# Patient Record
Sex: Male | Born: 1975 | Race: White | Hispanic: No | Marital: Married | Smoking: Never smoker
Health system: Southern US, Community
[De-identification: ages and names within clinical notes are randomized; demographics above are authoritative.]

---

## 2003-08-04 ENCOUNTER — Emergency Department (HOSPITAL_COMMUNITY): Admission: EM | Admit: 2003-08-04 | Discharge: 2003-08-04 | Payer: Self-pay | Admitting: Emergency Medicine

## 2003-08-08 ENCOUNTER — Ambulatory Visit (HOSPITAL_COMMUNITY): Admission: RE | Admit: 2003-08-08 | Discharge: 2003-08-08 | Payer: Self-pay

## 2005-04-10 ENCOUNTER — Emergency Department (HOSPITAL_COMMUNITY): Admission: EM | Admit: 2005-04-10 | Discharge: 2005-04-11 | Payer: Self-pay | Admitting: Family Medicine

## 2006-10-23 IMAGING — CT CT PELVIS W/ CM
2 of 5 series · 17 of 46 positions shown, 19 images · IV contrast (ORAL OMNI 350 & 75ML OMNI 300)
Comparison: 08/08/2003.

CLINICAL DATA: Abdominal pain.   Assess for appendicitis.  
ABDOMEN CT WITH CONTRAST:
TECHNIQUE: Multidetector CT imaging of the abdomen was performed following the standard protocol during bolus administration of intravenous contrast.
Contrast:  ________cc Omnipaque 300
TECHNIQUE: Multidetector CT imaging of the pelvis was performed following the standard protocol during bolus administration of intravenous contrast.
Urinary bladder negative.  Pelvic bowel loops are negative.  No lymphadenopathy.  
A tiny less than 3 mm stone is identified at the right UVJ.

[Series 2: routine abdomen · axial · 0.70mm/px · z∈[-451,-51]mm · 14 of 91 slices shown, 16 images]
[im 6/91  soft-tissue]
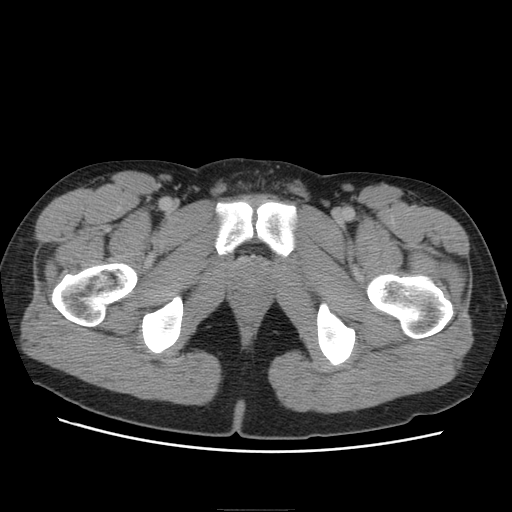
[im 6/91  bone]
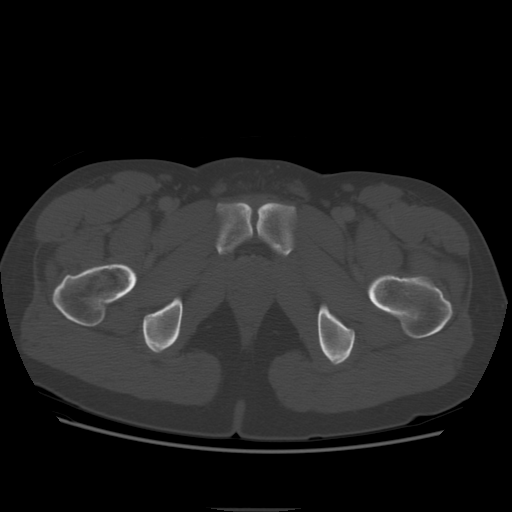
[im 11/91  soft-tissue]
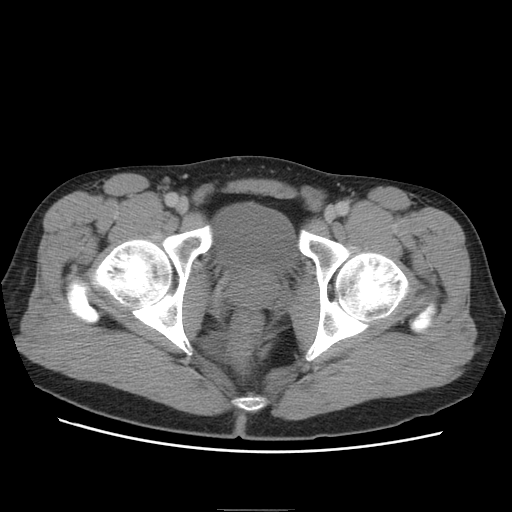
[im 21/91  soft-tissue]
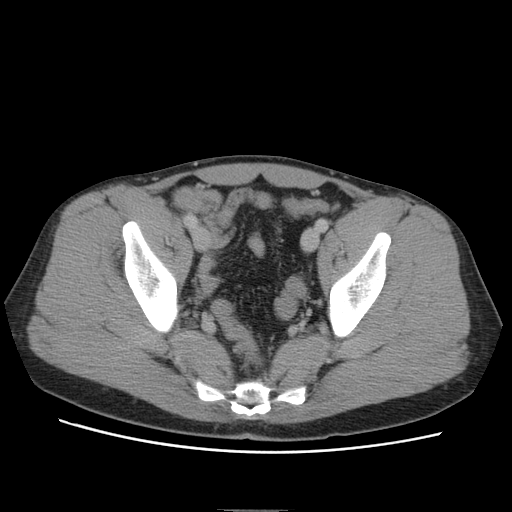
[im 26/91  soft-tissue]
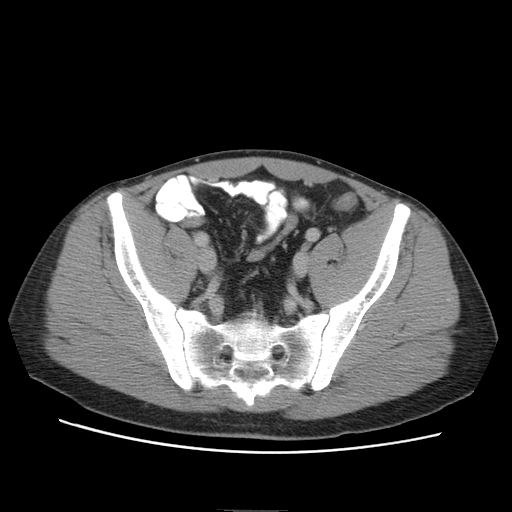
[im 31/91  soft-tissue]
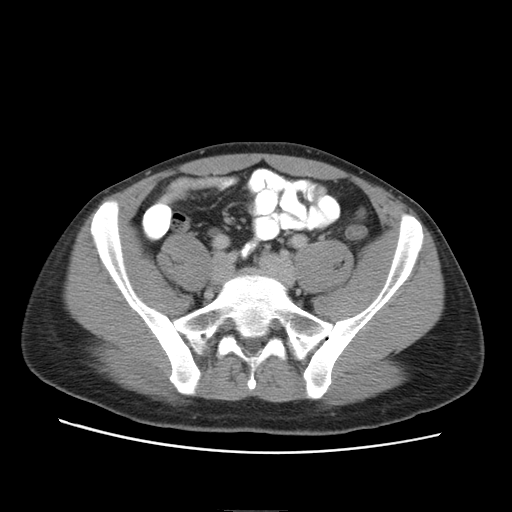
[im 36/91  soft-tissue]
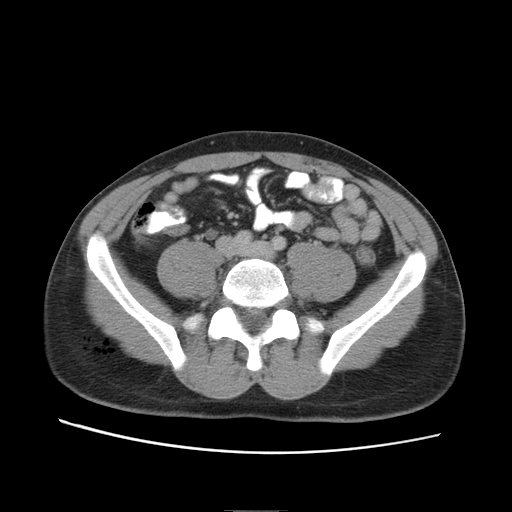
[im 41/91  soft-tissue]
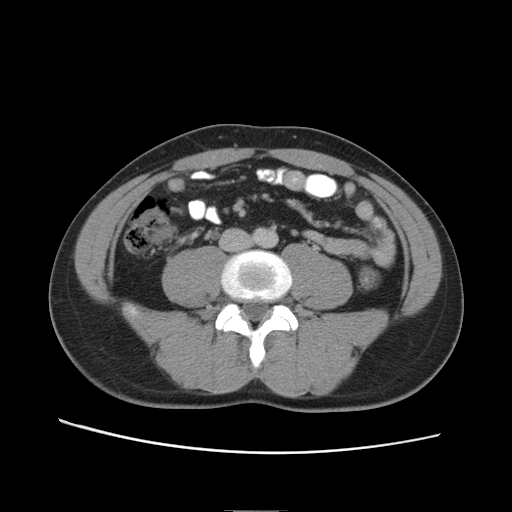
[im 51/91  soft-tissue]
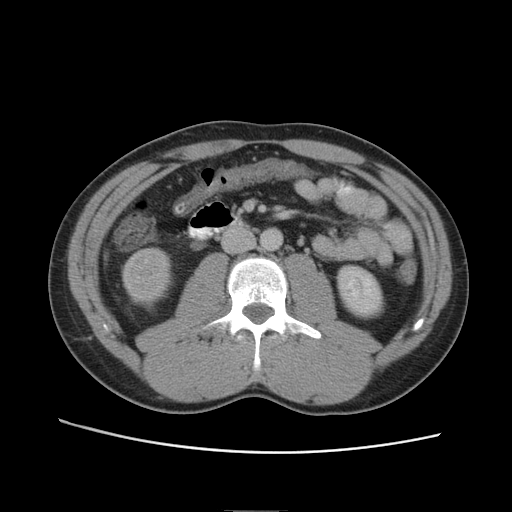
[im 56/91  soft-tissue]
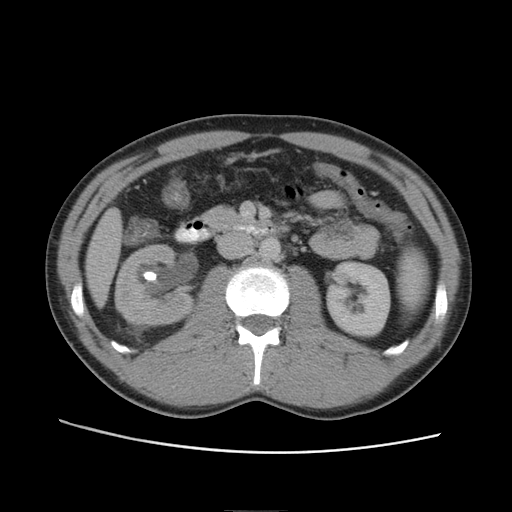
[im 56/91  bone]
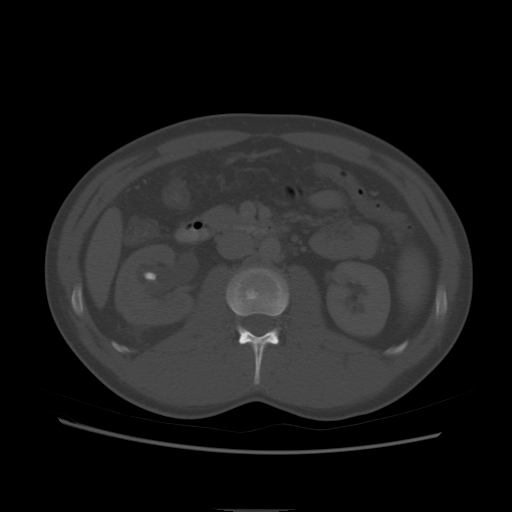
[im 61/91  soft-tissue]
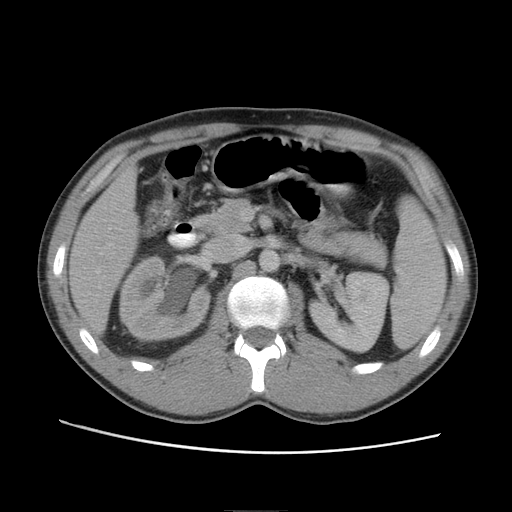
[im 66/91  soft-tissue]
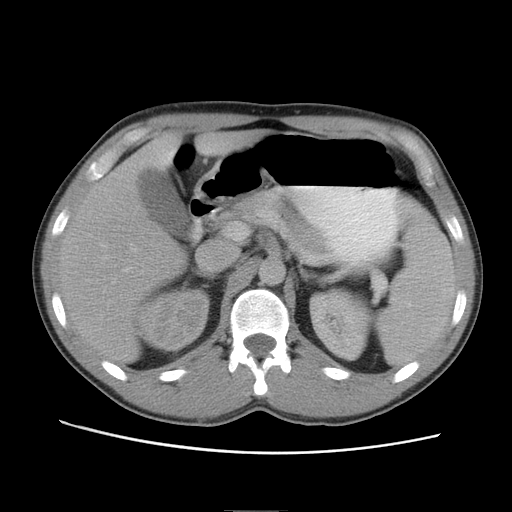
[im 71/91  soft-tissue]
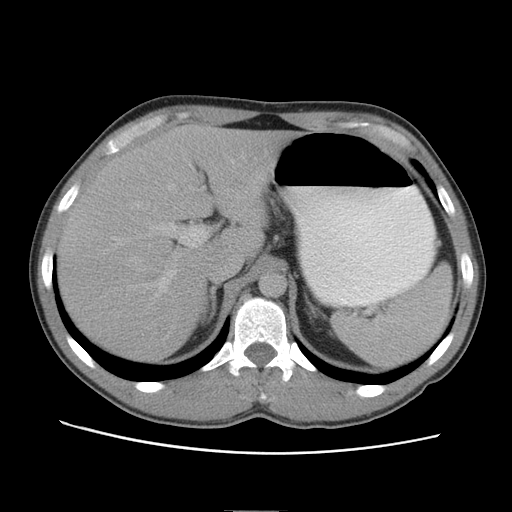
[im 81/91  soft-tissue]
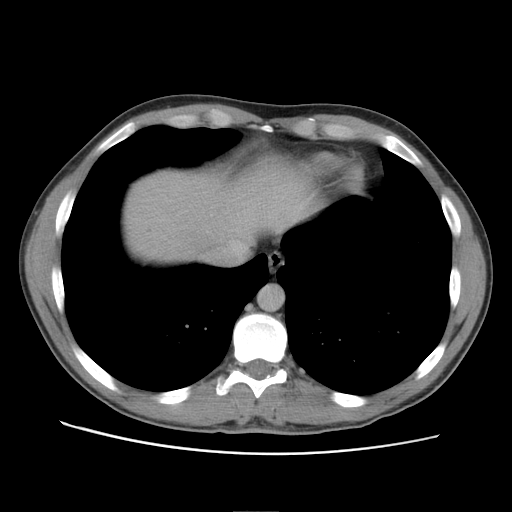
[im 86/91  soft-tissue]
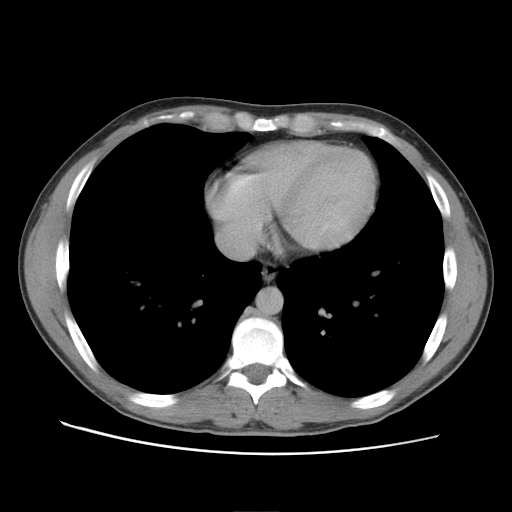

[Series 104: reformatted · coronal · 0.96mm/px · 3 of 109 slices shown]
[im 37/109  soft-tissue]
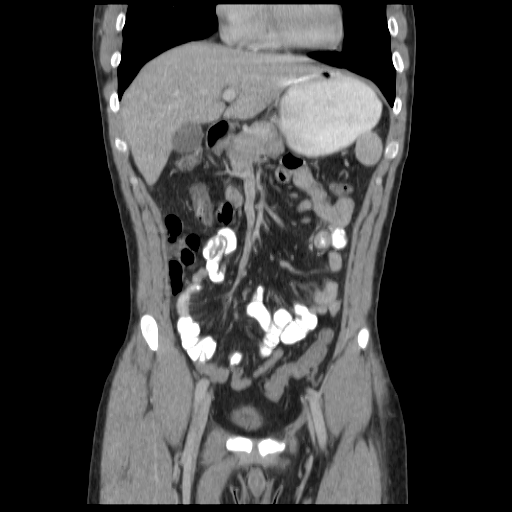
[im 49/109  soft-tissue]
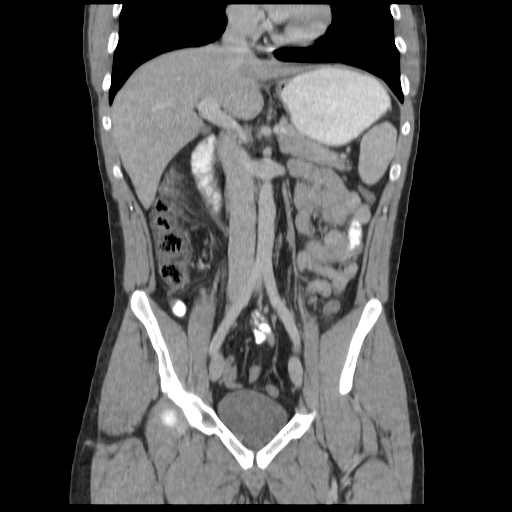
[im 61/109  soft-tissue]
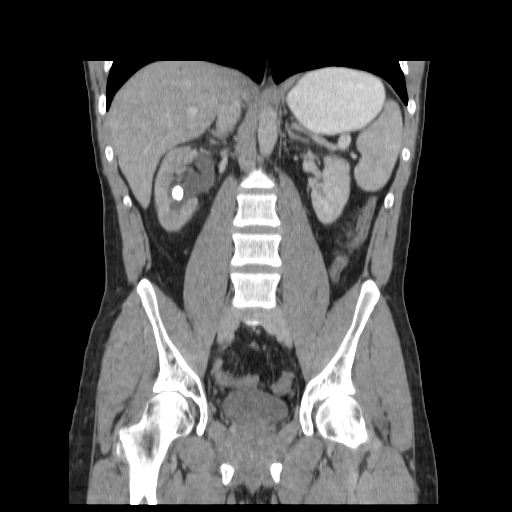

[17 of 46 positions shown; findings below may reference images not displayed]

FINDINGS: The visualized lung bases are clear.  
The liver is normal in attenuation and morphology.  
The spleen is negative.  Pancreas is negative.  
The left kidney is negative.  The adrenal glands are negative.  Within the lower pole collecting system of the left kidney there is an 11.3 x 6.7 mm stone.  
  There is delayed perfusion to the right kidney.  There is moderate to marked right-sided hydronephrosis and hydroureter.  There is a small less than 3 mm stone at the right UVJ. 
Negative for lymphadenopathy.  
Bowel loops are negative.
IMPRESSION: 1.  Large right renal stone. 
2.  Right-sided hydronephrosis with a small less than 3 mm right UVJ stone.  
PELVIS CT WITH CONTRAST:
IMPRESSION: Small right UVJ stone.

## 2007-01-31 ENCOUNTER — Emergency Department (HOSPITAL_COMMUNITY): Admission: EM | Admit: 2007-01-31 | Discharge: 2007-01-31 | Payer: Self-pay | Admitting: Emergency Medicine

## 2017-04-01 ENCOUNTER — Ambulatory Visit: Payer: Self-pay | Admitting: Nurse Practitioner

## 2017-04-01 ENCOUNTER — Encounter: Payer: Self-pay | Admitting: Nurse Practitioner

## 2017-04-01 VITALS — BP 110/80 | HR 85 | Temp 98.3°F | Resp 16 | Wt 204.4 lb

## 2017-04-01 DIAGNOSIS — J069 Acute upper respiratory infection, unspecified: Secondary | ICD-10-CM

## 2017-04-01 NOTE — Patient Instructions (Signed)

## 2017-04-01 NOTE — Progress Notes (Signed)
   Subjective:    Patient ID: Angela Adamhillip Mayorga, male    DOB: [birth year], 42 y.o.   MRN: [id number]  HPI Patient comes in today C/o headache, nausea and sore throat. No fever currently. Wife had flu last week.    Review of Systems  Constitutional: Negative for chills and fever.  HENT: Positive for congestion, postnasal drip and sore throat. Negative for trouble swallowing.   Respiratory: Negative for cough.   Cardiovascular: Negative.   Genitourinary: Negative.   Psychiatric/Behavioral: Negative.   All other systems reviewed and are negative.      Objective:   Physical Exam  Constitutional: He is oriented to person, place, and time. He appears well-developed and well-nourished. No distress.  HENT:  Right Ear: Hearing, tympanic membrane, external ear and ear canal normal.  Left Ear: Hearing, tympanic membrane, external ear and ear canal normal.  Nose: Mucosal edema and rhinorrhea present. Right sinus exhibits no maxillary sinus tenderness and no frontal sinus tenderness. Left sinus exhibits no maxillary sinus tenderness and no frontal sinus tenderness.  Mouth/Throat: Uvula is midline, oropharynx is clear and moist and mucous membranes are normal.  Neck: Normal range of motion.  Cardiovascular: Normal rate and regular rhythm.  Pulmonary/Chest: Effort normal and breath sounds normal.  Lymphadenopathy:    He has no cervical adenopathy.  Neurological: He is alert and oriented to person, place, and time.  Skin: Skin is warm.  Psychiatric: He has a normal mood and affect. His behavior is normal. Judgment and thought content normal.    BP 110/80 (BP Location: Right Arm, Patient Position: Sitting, Cuff Size: Normal)   Pulse 85   Temp 98.3 F (36.8 C) (Oral)   Resp 16   Wt 204 lb 6.4 oz (92.7 kg)   SpO2 96%        Assessment & Plan:   1. Viral upper respiratory infection   1. Take meds as prescribed 2. Use a cool mist humidifier especially during the winter months and when heat  has been humid. 3. Use saline nose sprays frequently 4. Saline irrigations of the nose can be very helpful if done frequently.  * 4X daily for 1 week*  * Use of a nettie pot can be helpful with this. Follow directions with this* 5. Drink plenty of fluids 6. Keep thermostat turn down low 7.For any cough or congestion  Use plain Mucinex- regular strength or max strength is fine   * Children- consult with Pharmacist for dosing 8. For fever or aces or pains- take tylenol or ibuprofen appropriate for age and weight.  * for fevers greater than 101 orally you may alternate ibuprofen and tylenol every  3 hours.    Mary-Margaret Daphine DeutscherMartin, FNP

## 2020-03-05 ENCOUNTER — Other Ambulatory Visit: Payer: Self-pay

## 2020-03-05 DIAGNOSIS — Z20822 Contact with and (suspected) exposure to covid-19: Secondary | ICD-10-CM

## 2020-03-09 LAB — NOVEL CORONAVIRUS, NAA

## 2020-12-03 ENCOUNTER — Other Ambulatory Visit: Payer: Self-pay

## 2020-12-03 ENCOUNTER — Encounter (HOSPITAL_COMMUNITY): Payer: Self-pay

## 2020-12-03 ENCOUNTER — Emergency Department (HOSPITAL_COMMUNITY)
Admission: EM | Admit: 2020-12-03 | Discharge: 2020-12-04 | Disposition: A | Discharge status: Home / Self Care | Payer: PRIVATE HEALTH INSURANCE | Attending: Emergency Medicine | Admitting: Emergency Medicine

## 2020-12-03 ENCOUNTER — Emergency Department (HOSPITAL_COMMUNITY): Payer: PRIVATE HEALTH INSURANCE

## 2020-12-03 DIAGNOSIS — R110 Nausea: Secondary | ICD-10-CM | POA: Diagnosis not present

## 2020-12-03 DIAGNOSIS — F419 Anxiety disorder, unspecified: Secondary | ICD-10-CM | POA: Diagnosis not present

## 2020-12-03 DIAGNOSIS — I10 Essential (primary) hypertension: Secondary | ICD-10-CM | POA: Diagnosis not present

## 2020-12-03 DIAGNOSIS — R002 Palpitations: Secondary | ICD-10-CM | POA: Diagnosis not present

## 2020-12-03 DIAGNOSIS — Z7982 Long term (current) use of aspirin: Secondary | ICD-10-CM | POA: Insufficient documentation

## 2020-12-03 DIAGNOSIS — R42 Dizziness and giddiness: Secondary | ICD-10-CM | POA: Diagnosis not present

## 2020-12-03 LAB — COMPREHENSIVE METABOLIC PANEL
ALT: 41 U/L (ref 0–44)
AST: 26 U/L (ref 15–41)
Albumin: 4.4 g/dL (ref 3.5–5.0)
Alkaline Phosphatase: 75 U/L (ref 38–126)
Anion gap: 8 (ref 5–15)
BUN: 12 mg/dL (ref 6–20)
CO2: 26 mmol/L (ref 22–32)
Calcium: 9.6 mg/dL (ref 8.9–10.3)
Chloride: 106 mmol/L (ref 98–111)
Creatinine, Ser: 0.89 mg/dL (ref 0.61–1.24)
GFR, Estimated: >60 mL/min (ref >60)
Glucose, Bld: 81 mg/dL (ref 70–99)
Potassium: 3.9 mmol/L (ref 3.5–5.1)
Sodium: 140 mmol/L (ref 135–145)
Total Bilirubin: 0.5 mg/dL (ref 0.3–1.2)
Total Protein: 7.1 g/dL (ref 6.5–8.1)

## 2020-12-03 LAB — CBC
HCT: 45.4 % (ref 39.0–52.0)
Hemoglobin: 15.7 g/dL (ref 13.0–17.0)
MCH: 30.2 pg (ref 26.0–34.0)
MCHC: 34.6 g/dL (ref 30.0–36.0)
MCV: 87.3 fL (ref 80.0–100.0)
Platelets: 310 10*3/uL (ref 150–400)
RBC: 5.2 MIL/uL (ref 4.22–5.81)
RDW: 12.7 % (ref 11.5–15.5)
WBC: 9.2 10*3/uL (ref 4.0–10.5)
nRBC: 0 % (ref 0.0–0.2)

## 2020-12-03 LAB — TROPONIN I (HIGH SENSITIVITY)
Troponin I (High Sensitivity): <2 ng/L (ref <18)
Troponin I (High Sensitivity): 3 ng/L (ref <18)

## 2020-12-03 LAB — TSH: TSH: 1.192 u[IU]/mL (ref 0.350–4.500)

## 2020-12-03 NOTE — ED Provider Notes (Signed)
[facility] Provider Triage Evaluation Note  Chris Collins , a 45 y.o. male  was evaluated in triage.  Pt complains of patient complains of having some heart palpitations soon after eating lunch.  He states he felt anxious and unwell with there was something wrong with him. He denies any chest pain but states that he has some achy discomfort in his left shoulder.  No nausea vomiting or diaphoresis.  No recreational drug use denies any alcohol use denies any history of anxiety.  No numbness or weakness slurred speech no confusion no trauma.  Denies any leg swelling or pain.  Review of Systems  Positive: Heart palpitations, SOB Negative: Fever  Physical Exam  BP (!) 171/102   Pulse 92   Temp 98.7 F (37.1 C) (Oral)   Resp 15   Ht 5\' 9"  (1.753 m)   Wt 95.3 kg   SpO2 99%   BMI 31.01 kg/m  Gen:   Awake, no distress   Resp:  Normal effort  MSK:   Moves extremities without difficulty  Other:  Somewhat anxious appearing 45 year old male in no acute distress.  Able answer questions appropriately follow commands.  Bilateral radial artery pulses 3+ and symmetric.  RRR  Medical Decision Making  Medically screening exam initiated at 4:20 PM.  Appropriate orders placed.  Chris Collins was informed that the remainder of the evaluation will be completed by another provider, this initial triage assessment does not replace that evaluation, and the importance of remaining in the ED until their evaluation is complete.  Patient is 45 year old male presented today with heart palpitations and some left shoulder discomfort that was atraumatic.  Patient states he has no medical issues denies any recreational drug use or alcohol use.   [address] [place], DOLE 12/03/20 1622    02/02/21, MD 12/07/20 [id number]

## 2020-12-03 NOTE — ED Triage Notes (Signed)
Reports after lunch started having palpitations and left shoulder started aching and feeling anxious.  Patient denies n/v or sweating.

## 2020-12-04 MED ORDER — ASPIRIN 81 MG PO CHEW: 81.0000 mg | CHEWABLE_TABLET | Freq: Every day | ORAL | 1 refills | Status: AC

## 2020-12-04 NOTE — ED Provider Notes (Signed)
[facility] EMERGENCY DEPARTMENT Provider Note   CSN: [id number] Arrival date & time: 12/03/20  1354     History Chief Complaint  Patient presents with   Palpitations    Chris Collins is a 45 y.o. male.  45 year old male without significant past medical history who presents emerged part today secondary to palpitations.  Patient states he finished lunch he walked back to his desk and felt his heart beating fast.  He looked at his watch and it was 114.  He felt a little bit anxious and nauseous and lightheaded at the same time.  Patient dates over the ensuing few hours his symptoms improved and his heart rate improved as well.  The highest he saw that was about 120.  But generally speaking it was in the low 100s.  Also has hypertension here which is new for him.  Also does not have a primary doctor.  No lower extremity swelling.  No change in his alcohol use or drug use.  States he has been trying to reduce his caffeine recently.  He was drinking 1 diet soda a day and is try to reduce that over the last couple weeks but no other significant change in caffeine intake.  No other new foods.  No over-the-counter medications.  No recent illnesses or trauma.   Palpitations     History reviewed. No pertinent past medical history.  There are no problems to display for this patient.   History reviewed. No pertinent surgical history.     No family history on file.  Social History   Tobacco Use   Smoking status: Never   Smokeless tobacco: Never  Vaping Use   Vaping Use: Never used  Substance Use Topics   Alcohol use: Never   Drug use: Never    Home Medications Prior to Admission medications   Medication Sig Start Date End Date Taking? Authorizing Provider  aspirin 81 MG chewable tablet Chew 1 tablet (81 mg total) by mouth daily. 12/04/20  Yes Derisha Funderburke, Barbara Cower, MD    Allergies    Patient has no known allergies.  Review of Systems   Review of Systems   Cardiovascular:  Positive for palpitations.  All other systems reviewed and are negative.  Physical Exam Updated Vital Signs BP (!) 143/104   Pulse 84   Temp 98.7 F (37.1 C) (Oral)   Resp 16   Ht 5\' 9"  (1.753 m)   Wt 95.3 kg   SpO2 100%   BMI 31.01 kg/m   Physical Exam Vitals and nursing note reviewed.  Constitutional:      Appearance: He is well-developed.  HENT:     Head: Normocephalic and atraumatic.     Nose: Nose normal. No congestion or rhinorrhea.     Mouth/Throat:     Mouth: Mucous membranes are moist.     Pharynx: Oropharynx is clear.  Eyes:     Pupils: Pupils are equal, round, and reactive to light.  Cardiovascular:     Rate and Rhythm: Normal rate.  Pulmonary:     Effort: Pulmonary effort is normal. No respiratory distress.  Abdominal:     General: Abdomen is flat. There is no distension.  Musculoskeletal:        General: No swelling or tenderness. Normal range of motion.     Cervical back: Normal range of motion.  Skin:    General: Skin is warm and dry.  Neurological:     General: No focal deficit present.  Mental Status: He is alert.    ED Results / Procedures / Treatments   Labs (all labs ordered are listed, but only abnormal results are displayed) Labs Reviewed  CBC  COMPREHENSIVE METABOLIC PANEL  TSH  TROPONIN I (HIGH SENSITIVITY)  TROPONIN I (HIGH SENSITIVITY)    EKG EKG Interpretation  Date/Time:  Monday December 03 2020 16:16:09 EDT Ventricular Rate:  94 PR Interval:  138 QRS Duration: 82 QT Interval:  352 QTC Calculation: 440 R Axis:   3 Text Interpretation: Normal sinus rhythm with sinus arrhythmia Nonspecific T wave abnormality Abnormal ECG Confirmed by Marily Memos [phone]) on 12/03/2020 11:47:05 PM  Radiology DG Chest 2 View  Result Date: 12/03/2020 CLINICAL DATA:  Chest pain EXAM: CHEST - 2 VIEW COMPARISON:  None. FINDINGS: The heart size and mediastinal contours are within normal limits. Both lungs are clear. The  visualized skeletal structures are unremarkable. IMPRESSION: Normal study. Electronically Signed   By: Charlett Nose M.D.   On: 12/03/2020 16:58    Procedures Procedures   Medications Ordered in ED Medications - No data to display  ED Course  I have reviewed the triage vital signs and the nursing notes.  Pertinent labs & imaging results that were available during my care of the patient were reviewed by me and considered in my medical decision making (see chart for details).    MDM Rules/Calculators/A&P                         45 year old male here with palpitations of unclear etiology.  His work-up was negative.  No further palpitations here.  His blood pressure was high when he got here so we will continue to monitor that at home.  Resources given to obtain primary care doctor and he will follow-up regarding his blood pressure and his palpitations.  Referral to cardiology placed in case patient has persistent palpitations however at this time in with again so he needs it but if he has more episodes or is more concerned about it he can obtain follow-up with cardiology.  Will return here for any new or worsening symptoms such as lightheadedness, syncope, persistent heart rate above 120   Final Clinical Impression(s) / ED Diagnoses Final diagnoses:  Palpitations  Hypertension, unspecified type    Rx / DC Orders ED Discharge Orders          Ordered    aspirin 81 MG chewable tablet  Daily        12/04/20 0056    Ambulatory referral to Cardiology        12/04/20 0057             Liyanna Cartwright, Barbara Cower, MD 12/04/20 4098

## 2021-01-11 ENCOUNTER — Other Ambulatory Visit: Payer: Self-pay

## 2021-01-11 ENCOUNTER — Encounter: Payer: Self-pay | Admitting: Cardiovascular Disease

## 2021-01-11 ENCOUNTER — Ambulatory Visit (INDEPENDENT_AMBULATORY_CARE_PROVIDER_SITE_OTHER): Payer: PRIVATE HEALTH INSURANCE

## 2021-01-11 ENCOUNTER — Ambulatory Visit (INDEPENDENT_AMBULATORY_CARE_PROVIDER_SITE_OTHER): Payer: PRIVATE HEALTH INSURANCE | Admitting: Cardiovascular Disease

## 2021-01-11 DIAGNOSIS — R002 Palpitations: Secondary | ICD-10-CM | POA: Diagnosis not present

## 2021-01-11 DIAGNOSIS — I10 Essential (primary) hypertension: Secondary | ICD-10-CM

## 2021-01-11 NOTE — Patient Instructions (Addendum)
Medication Instructions:  Your physician recommends that you continue on your current medications as directed. Please refer to the Current Medication list given to you today.  *If you need a refill on your cardiac medications before your next appointment, please call your pharmacy*   Lab Work: Your physician recommends that you return for lab work in: next week or 2 for FASTING lipid/liver profile.  If you have labs (blood work) drawn today and your tests are completely normal, you will receive your results only by: MyChart Message (if you have MyChart) OR A paper copy in the mail If you have any lab test that is abnormal or we need to change your treatment, we will call you to review the results.   Testing/Procedures: Your physician has requested that you have an echocardiogram. Echocardiography is a painless test that uses sound waves to create images of your heart. It provides your doctor with information about the size and shape of your heart and how well your heart's chambers and valves are working. This procedure takes approximately one hour. There are no restrictions for this procedure. This procedure is done at [address]  ZIO XT- Long Term Monitor Instructions  Your physician has requested you wear a ZIO patch monitor for 14 days.  This is a single patch monitor. [employer] supplies one patch monitor per enrollment. Additional stickers are not available. Please do not apply patch if you will be having a Nuclear Stress Test,  Echocardiogram, Cardiac CT, MRI, or Chest Xray during the period you would be wearing the  monitor. The patch cannot be worn during these tests. You cannot remove and re-apply the  ZIO XT patch monitor.  Your ZIO patch monitor will be mailed 3 day USPS to your address on file. It may take 3-5 days  to receive your monitor after you have been enrolled.  Once you have received your monitor, please review the enclosed instructions. Your monitor  has  already been registered assigning a specific monitor serial # to you.  Billing and Patient Assistance Program Information  We have supplied [employer] with any of your insurance information on file for billing purposes. [employer] offers a sliding scale Patient Assistance Program for patients that do not have  insurance, or whose insurance does not completely cover the cost of the ZIO monitor.  You must apply for the Patient Assistance Program to qualify for this discounted rate.  To apply, please call [employer] at [phone], select option 4, select option 2, ask to apply for  Patient Assistance Program. Meredeth Ide will ask your household income, and how many people  are in your household. They will quote your out-of-pocket cost based on that information.  [employer] will also be able to set up a 54-month, interest-free payment plan if needed.  Applying the monitor   Shave hair from upper left chest.  Hold abrader disc by orange tab. Rub abrader in 40 strokes over the upper left chest as  indicated in your monitor instructions.  Clean area with 4 enclosed alcohol pads. Let dry.  Apply patch as indicated in monitor instructions. Patch will be placed under collarbone on left  side of chest with arrow pointing upward.  Rub patch adhesive wings for 2 minutes. Remove white label marked "1". Remove the white  label marked "2". Rub patch adhesive wings for 2 additional minutes.  While looking in a mirror, press and release button in center of patch. A small green light will  flash 3-4 times. This  will be your only indicator that the monitor has been turned on.  Do not shower for the first 24 hours. You may shower after the first 24 hours.  Press the button if you feel a symptom. You will hear a small click. Record Date, Time and  Symptom in the Patient Logbook.  When you are ready to remove the patch, follow instructions on the last 2 pages of Patient  Logbook. Stick patch monitor onto the last page of  Patient Logbook.  Place Patient Logbook in the blue and white box. Use locking tab on box and tape box closed  securely. The blue and white box has prepaid postage on it. Please place it in the mailbox as  soon as possible. Your physician should have your test results approximately 7 days after the  monitor has been mailed back to [facility].  Call [facility] Customer Care at [phone] if you have questions regarding  your ZIO XT patch monitor. Call them immediately if you see an orange light blinking on your  monitor.  If your monitor falls off in less than 4 days, contact our Monitor department at [phone].  If your monitor becomes loose or falls off after 4 days call [employer] at [phone] for  suggestions on securing your monitor    Follow-Up: At [facility], you and your health needs are our priority.  As part of our continuing mission to provide you with exceptional heart care, we have created designated Provider Care Teams.  These Care Teams include your primary Cardiologist (physician) and Advanced Practice Providers (APPs -  Physician Assistants and Nurse Practitioners) who all work together to provide you with the care you need, when you need it.  We recommend signing up for the patient portal called "MyChart".  Sign up information is provided on this After Visit Summary.  MyChart is used to connect with patients for Virtual Visits (Telemedicine).  Patients are able to view lab/test results, encounter notes, upcoming appointments, etc.  Non-urgent messages can be sent to your provider as well.   To learn more about what you can do with MyChart, go to [email or web address].    Your next appointment:   3 month(s)  The format for your next appointment:   In Person  Provider:   Nanetta Batty, MD  Other Instructions Dr. Allyson Sabal has requested that you schedule an appointment with one of our clinical pharmacists for a blood pressure check appointment within the  next 4 weeks.  If you monitor your blood pressure (BP) at home, please bring your BP cuff and your BP readings with you to this appointment  HOW TO TAKE YOUR BLOOD PRESSURE: Rest 5 minutes before taking your blood pressure. Don't smoke or drink caffeinated beverages for at least 30 minutes before. Take your blood pressure before (not after) you eat. Sit comfortably with your back supported and both feet on the floor (don't cross your legs). Elevate your arm to heart level on a table or a desk. Use the proper sized cuff. It should fit smoothly and snugly around your bare upper arm. There should be enough room to slip a fingertip under the cuff. The bottom edge of the cuff should be 1 inch above the crease of the elbow. Ideally, take 3 measurements at one sitting and record the average.

## 2021-01-11 NOTE — Assessment & Plan Note (Signed)
Blood pressure today is 122/92 and at home it is in the 130s over mid 90s.  He does not eat a lot of salt.  He does not have a primary care physician so he is unaware of having blood pressure problems and in the past.  He may benefit from being on a drug such as Bystolic however I am going to have him keep a 30-day blood pressure log and will see a Pharm.D. back after that to review make further recommendations.

## 2021-01-11 NOTE — Assessment & Plan Note (Signed)
Chris Collins was seen in the emergency room on 12/04/2019 with palpitations.  His heart rate was in the 120 range according to his "apple watch".  When he arrived to the ER his heart rate had improved.  The work-up was unremarkable.  He has cut out caffeine since that time.  He does not drink alcohol.  His TSH is normal.  I am going to get a 2-week Zio patch and a 2D echo to further evaluate

## 2021-01-11 NOTE — Progress Notes (Signed)
01/11/2021 Chris Collins   1976-01-03  [id number]  Primary Physician Pcp, No Primary Cardiologist: Runell Gess MD Roseanne Reno  HPI:  Chris Collins is a 45 y.o. mildly overweight married Caucasian male father of 3 children who works as a Emergency planning/management officer for a [employer].  He was referred by the emergency room after he presented there on 12/03/2020 with palpitations and hypertension.  His work-up was unrevealing.  He has no cardiac risk factors.  There is no family history for heart disease.  He is never had a heart attack or stroke.  Denies chest pain or shortness of breath.  He was drinking Diet Coke once a day prior to this and has since discontinued caffeine intake.  He exercises sporadically.   Current Meds  Medication Sig   aspirin 81 MG chewable tablet Chew 1 tablet (81 mg total) by mouth daily.     No Known Allergies  Social History   Socioeconomic History   Marital status: Married    Spouse name: Not on file   Number of children: Not on file   Years of education: Not on file   Highest education level: Not on file  Occupational History   Not on file  Tobacco Use   Smoking status: Never   Smokeless tobacco: Never  Vaping Use   Vaping Use: Never used  Substance and Sexual Activity   Alcohol use: Never   Drug use: Never   Sexual activity: Not on file  Other Topics Concern   Not on file  Social History Narrative   Not on file   Social Determinants of Health   Financial Resource Strain: Not on file  Food Insecurity: Not on file  Transportation Needs: Not on file  Physical Activity: Not on file  Stress: Not on file  Social Connections: Not on file  Intimate Partner Violence: Not on file     Review of Systems: General: negative for chills, fever, night sweats or weight changes.  Cardiovascular: negative for chest pain, dyspnea on exertion, edema, orthopnea, palpitations, paroxysmal nocturnal dyspnea or shortness of  breath Dermatological: negative for rash Respiratory: negative for cough or wheezing Urologic: negative for hematuria Abdominal: negative for nausea, vomiting, diarrhea, bright red blood per rectum, melena, or hematemesis Neurologic: negative for visual changes, syncope, or dizziness All other systems reviewed and are otherwise negative except as noted above.    Blood pressure (!) 122/92, pulse (!) 110, height 5\' 9"  (1.753 m), weight 215 lb 3.2 oz (97.6 kg), SpO2 97 %.  General appearance: alert and no distress Neck: no adenopathy, no carotid bruit, no JVD, supple, symmetrical, trachea midline, and thyroid not enlarged, symmetric, no tenderness/mass/nodules Lungs: clear to auscultation bilaterally Heart: regular rate and rhythm, S1, S2 normal, no murmur, click, rub or gallop Extremities: extremities normal, atraumatic, no cyanosis or edema Pulses: 2+ and symmetric Skin: Skin color, texture, turgor normal. No rashes or lesions Neurologic: Grossly normal  EKG sinus tachycardia 110 without ST or T wave changes.  Personally reviewed this EKG.  ASSESSMENT AND PLAN:   Palpitations Mr. [name] was seen in the emergency room on 12/04/2019 with palpitations.  His heart rate was in the 120 range according to his "apple watch".  When he arrived to the ER his heart rate had improved.  The work-up was unremarkable.  He has cut out caffeine since that time.  He does not drink alcohol.  His TSH is normal.  I am going to get a  2-week Zio patch and a 2D echo to further evaluate  Essential hypertension Blood pressure today is 122/92 and at home it is in the 130s over mid 90s.  He does not eat a lot of salt.  He does not have a primary care physician so he is unaware of having blood pressure problems and in the past.  He may benefit from being on a drug such as Bystolic however I am going to have him keep a 30-day blood pressure log and will see a Pharm.D. back after that to review make further  recommendations.     Lorretta Harp MD FACP,FACC,FAHA, [facility] 01/11/2021 2:10 PM

## 2021-01-11 NOTE — Progress Notes (Unsigned)
Enrolled patient for a 14 day Zio XT  monitor to be mailed to patients home  °

## 2021-01-14 DIAGNOSIS — R002 Palpitations: Secondary | ICD-10-CM | POA: Diagnosis not present

## 2021-01-23 LAB — LIPID PANEL
Chol/HDL Ratio: 4.4 ratio (ref 0.0–5.0)
Cholesterol, Total: 168 mg/dL (ref 100–199)
HDL: 38 mg/dL — ABNORMAL LOW (ref >39)
LDL Chol Calc (NIH): 104 mg/dL — ABNORMAL HIGH (ref 0–99)
Triglycerides: 146 mg/dL (ref 0–149)
VLDL Cholesterol Cal: 26 mg/dL (ref 5–40)

## 2021-01-23 LAB — HEPATIC FUNCTION PANEL
ALT: 24 IU/L (ref 0–44)
AST: 21 IU/L (ref 0–40)
Albumin: 4.6 g/dL (ref 4.0–5.0)
Alkaline Phosphatase: 74 IU/L (ref 44–121)
Bilirubin Total: 0.6 mg/dL (ref 0.0–1.2)
Bilirubin, Direct: 0.15 mg/dL (ref 0.00–0.40)
Total Protein: 6.6 g/dL (ref 6.0–8.5)

## 2021-02-04 ENCOUNTER — Ambulatory Visit (HOSPITAL_COMMUNITY): Payer: PRIVATE HEALTH INSURANCE

## 2021-02-08 ENCOUNTER — Other Ambulatory Visit: Payer: Self-pay

## 2021-02-08 ENCOUNTER — Ambulatory Visit (INDEPENDENT_AMBULATORY_CARE_PROVIDER_SITE_OTHER): Payer: PRIVATE HEALTH INSURANCE | Admitting: Pharmacist

## 2021-02-08 VITALS — BP 138/92 | HR 108

## 2021-02-08 DIAGNOSIS — R002 Palpitations: Secondary | ICD-10-CM

## 2021-02-08 DIAGNOSIS — I10 Essential (primary) hypertension: Secondary | ICD-10-CM

## 2021-02-08 MED ORDER — NEBIVOLOL HCL 2.5 MG PO TABS: 2.5000 mg | ORAL_TABLET | Freq: Every day | ORAL | 2 refills | Status: DC

## 2021-02-08 NOTE — Patient Instructions (Addendum)
It was nice meeting you today  We would like your blood pressure to be less than 130/80  We will start a new medication called nebivolol 2.5mg  which you will take once a day with or without food  Continue to monitor your blood pressure and pulse at home and call if there are any problems  Please call with any questions!  Laural Golden, PharmD, BCACP, CDCES, CPP [address] [place], [place], [address] Phone: [phone], Fax: [phone]

## 2021-02-08 NOTE — Progress Notes (Signed)
Patient ID: Chris Collins                 DOB: 07-Jun-1975                      MRN: [id number]     HPI: Chris Collins is a 45 y.o. male referred by Dr. Allyson Sabal to HTN clinic.  Has no significant past medical history.  Presented to ER on 12/15/20 with palpitations, tachycardia, and HTN.  Work up was unremarkable and patient was discharged.  Patient seen by Dr Allyson Sabal on 01/11/21.  BP 122/92 however pulse remained elevated at 110.  Patient was sent home with a Zio patch.  Patient presents today in good spirits. Took the stairs because he does not like the elevators in the building and he feels like his HR is rapid.  Reports no symptoms of HTN but can tell when HR is elevated.  Reports no stressors, anxiety, and has discontinued caffeine at last visit.  Patient uses an Om[employer] BP machine which he brought. Has been keeping a log.  12/15: 134/87, 101 12/14: 136/91, 110 12/11: 130/97, 85 12/9: 130/82, 87 12/7: 138/87, 88 12/4: 137/92, 88 12/3: 136/91, 70 12/1: 139/92, 90  Is trying to reduce sodium intake.  Does not eat fast food.  Works as a Emergency planning/management officer for a [employer].  In office readings:  142/90 (Home cuff) 130/93 (Office machine) 138/92 (manual)  Current HTN meds: n/a Previously tried: n/a BP goal: <130/80  Wt Readings from Last 3 Encounters:  01/11/21 215 lb 3.2 oz (97.6 kg)  12/03/20 210 lb (95.3 kg)  04/01/17 204 lb 6.4 oz (92.7 kg)   BP Readings from Last 3 Encounters:  01/11/21 (!) 122/92  12/03/20 (!) 143/104  04/01/17 110/80   Pulse Readings from Last 3 Encounters:  01/11/21 (!) 110  12/03/20 84  04/01/17 85    Renal function: CrCl cannot be calculated (Patient's most recent lab result is older than the maximum 21 days allowed.).  No past medical history on file.  Current Outpatient Medications on File Prior to Visit  Medication Sig Dispense Refill   aspirin 81 MG chewable tablet Chew 1 tablet (81 mg total) by mouth daily. 30 tablet 1   No current  facility-administered medications on file prior to visit.    No Known Allergies   Assessment/Plan:  1. Hypertension -  Patient BP in room 138/92 with pulse of 108. This is above goal of <130/80. Due to elevated BP and pulse, may benefit from low dose BB.  Will start Bystolic 2.5mg  once daily.  Recommended patient continue to check BP and pulse rates at home.  May need to titrate up to 5mg  in future.  Recheck in 4 weeks.  Start Bystolic 2.5mg  daily Recheck in 4 weeks  , PharmD, BCACP, CDCES, CPP [address] [place], [place], [place] Phone: [phone], Fax: [phone]

## 2021-02-27 ENCOUNTER — Other Ambulatory Visit: Payer: Self-pay

## 2021-02-27 ENCOUNTER — Ambulatory Visit (HOSPITAL_COMMUNITY): Payer: No Typology Code available for payment source | Attending: Cardiology

## 2021-02-27 DIAGNOSIS — R002 Palpitations: Secondary | ICD-10-CM | POA: Insufficient documentation

## 2021-02-27 DIAGNOSIS — I10 Essential (primary) hypertension: Secondary | ICD-10-CM | POA: Diagnosis not present

## 2021-02-27 LAB — ECHOCARDIOGRAM COMPLETE
Area-P 1/2: 3.97 cm2
S' Lateral: 2.9 cm

## 2021-02-27 MED ORDER — PERFLUTREN LIPID MICROSPHERE
1.0000 mL | INTRAVENOUS | Status: AC | PRN
  Administered 2021-02-27: 2 mL via INTRAVENOUS

## 2021-03-08 ENCOUNTER — Ambulatory Visit (INDEPENDENT_AMBULATORY_CARE_PROVIDER_SITE_OTHER): Payer: PRIVATE HEALTH INSURANCE | Admitting: Pharmacist

## 2021-03-08 ENCOUNTER — Other Ambulatory Visit: Payer: Self-pay

## 2021-03-08 VITALS — BP 136/85 | HR 70

## 2021-03-08 DIAGNOSIS — I10 Essential (primary) hypertension: Secondary | ICD-10-CM

## 2021-03-08 DIAGNOSIS — R002 Palpitations: Secondary | ICD-10-CM

## 2021-03-08 MED ORDER — NEBIVOLOL HCL 5 MG PO TABS: 5.0000 mg | ORAL_TABLET | Freq: Every day | ORAL | 2 refills | Status: DC

## 2021-03-08 NOTE — Patient Instructions (Addendum)
It was nice seeing you again  We would like your blood pressure to stay less than 130/80  We will increase your nebivolol to 5mg  once a day.  You can take 2 of your 2.5mg  tablets until you pick up th new prescription  Continue to check your blood pressure and pulse rate at ome and call with any concerns  Here is the phone number for the clinic on [address].   [phone], PharmD, BCACP, CDCES, CPP [address], [address] [place], [place], [place] Phone: [phone], Fax: [phone]

## 2021-03-08 NOTE — Progress Notes (Signed)
Patient ID: Chris Collins                 DOB: 28-Nov-1975                      MRN: [id number]     HPI: Chris Collins is a 46 y.o. male referred by Dr. Allyson Sabal to HTN clinic. Has no significant past medical history.  Presented to ER on 12/15/20 with palpitations, tachycardia, and HTN.  Work up was unremarkable and patient was discharged.  Patient seen in clinic last month and blood pressure was consistently in 130s/80s-90s with pulse rates 85-110.  Patient was started on nebivolol 2.5mg  once daily.  Patient presents today in good spirits.  Has been checking BP at home twice daily.  Pulse rate has decreased although BP remains elevated.  Home readings: 1/13: 139/93 70 1/11: 138/88 75 1/8: 125/90 97 1/7: 133/95 69 1/6: 136/89 90 1/6: 119/82 88 1/5: 141/89 75    Current HTN meds: Nebivolol 2.5mg  Previously tried: N/A BP goal: <130/80  Wt Readings from Last 3 Encounters:  01/11/21 215 lb 3.2 oz (97.6 kg)  12/03/20 210 lb (95.3 kg)  04/01/17 204 lb 6.4 oz (92.7 kg)   BP Readings from Last 3 Encounters:  02/08/21 (!) 138/92  01/11/21 (!) 122/92  12/03/20 (!) 143/104   Pulse Readings from Last 3 Encounters:  02/08/21 (!) 108  01/11/21 (!) 110  12/03/20 84    Renal function: CrCl cannot be calculated (Patient's most recent lab result is older than the maximum 21 days allowed.).  No past medical history on file.  Current Outpatient Medications on File Prior to Visit  Medication Sig Dispense Refill   aspirin 81 MG chewable tablet Chew 1 tablet (81 mg total) by mouth daily. 30 tablet 1   nebivolol (BYSTOLIC) 2.5 MG tablet Take 1 tablet (2.5 mg total) by mouth daily. 30 tablet 2   No current facility-administered medications on file prior to visit.    No Known Allergies   Assessment/Plan:  1. Hypertension -  Patient BP in room 136/85 which is above goal of <130/80.  BP has not decreased much since starting nebivolol however patient no longer has tachycardia and reports  he feels well.  Will increase nebivolol to 5mg  once daily and gave patient instructions to continue checking BP at home.  Recheck in clinic in 4 weeks.  Gave patient phone number for PCP at [facility].  [facility], PharmD, BCACP, CDCES, CPP [address], [address] [place], [place], [place] Phone: [phone], Fax: [phone]

## 2021-04-17 ENCOUNTER — Other Ambulatory Visit: Payer: Self-pay

## 2021-04-17 ENCOUNTER — Encounter: Payer: Self-pay | Admitting: Cardiovascular Disease

## 2021-04-17 ENCOUNTER — Ambulatory Visit (INDEPENDENT_AMBULATORY_CARE_PROVIDER_SITE_OTHER): Payer: PRIVATE HEALTH INSURANCE | Admitting: Pharmacist

## 2021-04-17 ENCOUNTER — Ambulatory Visit (INDEPENDENT_AMBULATORY_CARE_PROVIDER_SITE_OTHER): Payer: PRIVATE HEALTH INSURANCE | Admitting: Cardiovascular Disease

## 2021-04-17 VITALS — BP 138/86 | HR 61 | Ht 69.0 in | Wt 215.0 lb

## 2021-04-17 VITALS — BP 138/88 | HR 71 | Resp 16 | Ht 69.0 in | Wt 215.6 lb

## 2021-04-17 DIAGNOSIS — I10 Essential (primary) hypertension: Secondary | ICD-10-CM

## 2021-04-17 DIAGNOSIS — R002 Palpitations: Secondary | ICD-10-CM | POA: Diagnosis not present

## 2021-04-17 MED ORDER — OLMESARTAN MEDOXOMIL 5 MG PO TABS: 5.0000 mg | ORAL_TABLET | Freq: Every day | ORAL | 5 refills | Status: DC

## 2021-04-17 NOTE — Assessment & Plan Note (Signed)
History of essential hypertension with blood pressure measured today at 138/88.  I reviewed his blood pressure log which shows marked improvement in his heart rate and blood pressure on Bystolic and olmesartan followed by our Pharm.D.'s.

## 2021-04-17 NOTE — Patient Instructions (Signed)
It was good seeing you again  We would like your blood pressure to be less than 130/80.  Your blood pressure has improved.  We will start one new medication called olmesartan 5mg .  You can take 1 tablet once a day  I would like to check your lab work in about 1-2 weeks after you start  Please send your readings over myChart  Let me know if you have any questions!  Karren Cobble, PharmD, BCACP, [place], [place], [place], [place], [address] Phone: [phone], Fax: [phone]

## 2021-04-17 NOTE — Assessment & Plan Note (Signed)
History of palpitations in the past with event monitor performed 02/05/2021 revealing occasional PACs, PVCs and short runs of SVT.  He has not had any further palpitations since I saw him 3 months ago currently on Bystolic.

## 2021-04-17 NOTE — Patient Instructions (Signed)

## 2021-04-17 NOTE — Progress Notes (Signed)
04/17/2021 Leonidas Romberg   04-25-1975  [id number]  Primary Physician Pcp, No Primary Cardiologist: Lorretta Harp MD Renae Gloss  HPI:  Chris Collins is a 46 y.o.  mildly overweight married Caucasian male father of 3 children who works as a Government social research officer for a [employer]  He was referred by the emergency room after he presented there on 12/03/2020 with palpitations and hypertension.  His work-up was unrevealing.  I last saw him in the office 01/11/2021.  He has no cardiac risk factors.  There is no family history for heart disease.  He is never had a heart attack or stroke.  Denies chest pain or shortness of breath.  He was drinking Diet Coke once a day prior to this and has since discontinued caffeine intake.  He exercises sporadically.  Since I saw him I did get a 2D echo cardiogram 02/27/2021 that was essentially normal and an event monitor 02/05/2021 that showed occasional PACs, PVCs and short runs of SVT.  I referred him to our Pharm.D. hypertension clinic who began him on Bystolic and olmesartan.  His palpitations have resolved, his heart rate has come down and his blood pressure is improved.   Current Meds  Medication Sig   aspirin 81 MG chewable tablet Chew 1 tablet (81 mg total) by mouth daily.   nebivolol (BYSTOLIC) 5 MG tablet Take 1 tablet (5 mg total) by mouth daily.   olmesartan (BENICAR) 5 MG tablet Take 1 tablet (5 mg total) by mouth daily.     No Known Allergies  Social History   Socioeconomic History   Marital status: Married    Spouse name: Not on file   Number of children: Not on file   Years of education: Not on file   Highest education level: Not on file  Occupational History   Not on file  Tobacco Use   Smoking status: Never   Smokeless tobacco: Never  Vaping Use   Vaping Use: Never used  Substance and Sexual Activity   Alcohol use: Never   Drug use: Never   Sexual activity: Not on file  Other Topics Concern   Not on file   Social History Narrative   Not on file   Social Determinants of Health   Financial Resource Strain: Not on file  Food Insecurity: Not on file  Transportation Needs: Not on file  Physical Activity: Not on file  Stress: Not on file  Social Connections: Not on file  Intimate Partner Violence: Not on file     Review of Systems: General: negative for chills, fever, night sweats or weight changes.  Cardiovascular: negative for chest pain, dyspnea on exertion, edema, orthopnea, palpitations, paroxysmal nocturnal dyspnea or shortness of breath Dermatological: negative for rash Respiratory: negative for cough or wheezing Urologic: negative for hematuria Abdominal: negative for nausea, vomiting, diarrhea, bright red blood per rectum, melena, or hematemesis Neurologic: negative for visual changes, syncope, or dizziness All other systems reviewed and are otherwise negative except as noted above.    Blood pressure 138/86, pulse 61, height 5\' 9"  (1.753 m), weight 215 lb (97.5 kg), SpO2 97 %.  General appearance: alert and no distress Neck: no adenopathy, no carotid bruit, no JVD, supple, symmetrical, trachea midline, and thyroid not enlarged, symmetric, no tenderness/mass/nodules Lungs: clear to auscultation bilaterally Heart: regular rate and rhythm, S1, S2 normal, no murmur, click, rub or gallop Extremities: extremities normal, atraumatic, no cyanosis or edema Pulses: 2+ and symmetric Skin: Skin color,  texture, turgor normal. No rashes or lesions Neurologic: Grossly normal  EKG sinus rhythm at 61 without ST or T wave changes.  I personally reviewed this EKG.  ASSESSMENT AND PLAN:   Essential hypertension History of essential hypertension with blood pressure measured today at 138/88.  I reviewed his blood pressure log which shows marked improvement in his heart rate and blood pressure on Bystolic and olmesartan followed by our Pharm.D.'s.  Palpitations History of palpitations in the  past with event monitor performed 02/05/2021 revealing occasional PACs, PVCs and short runs of SVT.  He has not had any further palpitations since I saw him 3 months ago currently on Bystolic.     Lorretta Harp MD FACP,FACC,FAHA, [facility] 04/17/2021 1:57 PM

## 2021-04-17 NOTE — Progress Notes (Signed)
Patient ID: Chris Collins                 DOB: 27-Jul-1975                      MRN: [id number]     HPI: Chris Collins is a 46 y.o. male referred by Dr. Allyson Sabal to HTN clinic. Has no significant PMH.   Presented to ER on 12/15/20 with palpitations, tachycardia, and HTN.  Work up was unremarkable and patient was discharged.   At first visit with HTN clinic, BP was consistently in 130s/80s-90s with pulse rates 70-110.  Patient was started on nebivolol 2.5mg  once daily.  At second visit, BP remained unchanged but pulse rate had decreased.  Nebivolol was increased to 5mg  daily.  Patient was given phone number to establish with PCP at [facility].  Patient presents today in good spirits.  Feels well. BP has begun to decrease.  Established with PCP at [facility].   2/22: 128/80, 70 2/19: 138/94, 65 2/14: 122/88, 80 2/13: 120/83, 93 2/10: 129/88, 90   Current HTN meds: Nebivolol 5mg  daily Previously tried: N/A BP goal: 130/80   Wt Readings from Last 3 Encounters:  01/11/21 215 lb 3.2 oz (97.6 kg)  12/03/20 210 lb (95.3 kg)  04/01/17 204 lb 6.4 oz (92.7 kg)   BP Readings from Last 3 Encounters:  03/08/21 136/85  02/08/21 (!) 138/92  01/11/21 (!) 122/92   Pulse Readings from Last 3 Encounters:  03/08/21 70  02/08/21 (!) 108  01/11/21 (!) 110    Renal function: CrCl cannot be calculated (Patient's most recent lab result is older than the maximum 21 days allowed.).  No past medical history on file.  Current Outpatient Medications on File Prior to Visit  Medication Sig Dispense Refill   aspirin 81 MG chewable tablet Chew 1 tablet (81 mg total) by mouth daily. 30 tablet 1   nebivolol (BYSTOLIC) 5 MG tablet Take 1 tablet (5 mg total) by mouth daily. 30 tablet 2   No current facility-administered medications on file prior to visit.    No Known Allergies   Assessment/Plan:  1. Hypertension -  Patient BP in room today 133/88 which is above goal of <130/80.  BP has improved in  past visits although diastolic readings remain elevated.  Would benefit from ARB.  Will start low dose olmesartan 5mg  daily and check BMP in 1-2 weeks.  Continue nebivolol.  Patient will send in readings through mychart.  02/10/21, PharmD, BCACP, CDCES, CPP [address], [address] [place], [place], [address] Phone: [phone], Fax: [phone]

## 2021-04-30 ENCOUNTER — Ambulatory Visit (HOSPITAL_BASED_OUTPATIENT_CLINIC_OR_DEPARTMENT_OTHER): Payer: No Typology Code available for payment source | Admitting: Family Medicine

## 2021-04-30 ENCOUNTER — Other Ambulatory Visit: Payer: Self-pay

## 2021-04-30 ENCOUNTER — Encounter (HOSPITAL_BASED_OUTPATIENT_CLINIC_OR_DEPARTMENT_OTHER): Payer: Self-pay | Admitting: Family Medicine

## 2021-04-30 VITALS — BP 122/82 | HR 82 | Ht 69.0 in | Wt 212.0 lb

## 2021-04-30 DIAGNOSIS — Z0000 Encounter for general adult medical examination without abnormal findings: Secondary | ICD-10-CM

## 2021-04-30 DIAGNOSIS — I10 Essential (primary) hypertension: Secondary | ICD-10-CM | POA: Diagnosis not present

## 2021-04-30 NOTE — Patient Instructions (Signed)
?  ? ? ? ?  Follow-Up: ?Your next appointment:   ?Your physician recommends that you schedule a follow-up appointment in: 6 months 1 week before for lab with Dr. Tennis Must [place] ? ?You will receive a text message or e-mail with a link to a survey about your care and experience with [place] today! We would greatly appreciate your feedback!  ? ?Thanks for letting us be apart of your health journey!!  ?Primary Care and Sports Medicine  ? ?Dr. Kyung Rudd de [place]  ? ?We encourage you to activate your patient portal called "MyChart".  Sign up information is provided on this After Visit Summary.  MyChart is used to connect with patients for Virtual Visits (Telemedicine).  Patients are able to view lab/test results, encounter notes, upcoming appointments, etc.  Non-urgent messages can be sent to your provider as well. To learn more about what you can do with MyChart, please visit --  [email or web address].    ?

## 2021-04-30 NOTE — Assessment & Plan Note (Signed)
Blood pressure controlled in office today ?We will continue with nebivolol, olmesartan ?Recommend DASH diet, handout provided ?Recommend regular aerobic exercise, intermittent monitoring at home ?

## 2021-04-30 NOTE — Progress Notes (Signed)
? ?New Patient Office Visit ? ?Subjective:  ?Patient ID: Chris Collins, male    DOB: [birth year]  Age: 46 y.o. MRN: [id number] ? ?CC:  ?Chief Complaint  ?Patient presents with  ? New Patient (Initial Visit)  ?  Patient presents today to establish care. No other issues to discuss. He was referred by Dr Allyson Sabal. Dr Allyson Sabal just had current labs drawn.  ? ? ?HPI ?Chris Collins is a 46 year old male presenting to establish in clinic.  Current concerns as outlined above.  Past medical history significant for hypertension.  Indicates that his last time seeing a primary care physician was as a child. ? ?HTN: Has established with cardiology following visit to emergency department where he was found to be hypertensive.  Through cardiology, patient has been started on olmesartan and nebivolol.  He has been doing well with his medications.  Reports that blood pressure is better controlled and he is no longer having palpitations like he did before. ? ?Patient is originally from [place], has been living here since 2000.  He works as a Emergency planning/management officer for a [employer].  Outside of work he enjoys playing soccer, video games, spending time with his family. ? ?History reviewed. No pertinent past medical history. ? ?History reviewed. No pertinent surgical history. ? ?History reviewed. No pertinent family history. ? ?Social History  ? ?Socioeconomic History  ? Marital status: Married  ?  Spouse name: Not on file  ? Number of children: Not on file  ? Years of education: Not on file  ? Highest education level: Not on file  ?Occupational History  ? Not on file  ?Tobacco Use  ? Smoking status: Never  ? Smokeless tobacco: Never  ?Vaping Use  ? Vaping Use: Never used  ?Substance and Sexual Activity  ? Alcohol use: Never  ? Drug use: Never  ? Sexual activity: Not on file  ?Other Topics Concern  ? Not on file  ?Social History Narrative  ? Not on file  ? ?Social Determinants of Health  ? ?Financial Resource Strain: Not on file  ?Food  Insecurity: Not on file  ?Transportation Needs: Not on file  ?Physical Activity: Not on file  ?Stress: Not on file  ?Social Connections: Not on file  ?Intimate Partner Violence: Not on file  ? ? ?Objective:  ? ?Today's Vitals: BP 122/82   Pulse 82   Ht 5\' 9"  (1.753 m)   Wt 212 lb (96.2 kg)   SpO2 96%   BMI 31.31 kg/m?  ? ?Physical Exam ? ?46 year old male in no acute distress ?Cardiovascular exam with regular rate and rhythm, no murmur appreciated ?Lungs clear to auscultation bilaterally ? ?Assessment & Plan:  ? ?Problem List Items Addressed This Visit   ? ?  ? Cardiovascular and Mediastinum  ? Essential hypertension - Primary  ?  Blood pressure controlled in office today ?We will continue with nebivolol, olmesartan ?Recommend DASH diet, handout provided ?Recommend regular aerobic exercise, intermittent monitoring at home ?  ?  ? ?Other Visit Diagnoses   ? ? Wellness examination      ? Relevant Orders  ? CBC with Differential/Platelet  ? Comprehensive metabolic panel  ? Hemoglobin A1c  ? Lipid panel  ? TSH Rfx on Abnormal to Free T4  ? ?  ? ? ?Outpatient Encounter Medications as of 04/30/2021  ?Medication Sig  ? aspirin 81 MG chewable tablet Chew 1 tablet (81 mg total) by mouth daily.  ? nebivolol (BYSTOLIC) 5 MG tablet Take 1 tablet (5  mg total) by mouth daily.  ? olmesartan (BENICAR) 5 MG tablet Take 1 tablet (5 mg total) by mouth daily.  ? ?No facility-administered encounter medications on file as of 04/30/2021.  ? ? ?Follow-up: Return in about 6 months (around 10/31/2021).  ? ?Loys Hoselton J De [place], MD ? ?

## 2021-05-03 ENCOUNTER — Encounter: Payer: Self-pay | Admitting: Pharmacist

## 2021-05-03 ENCOUNTER — Other Ambulatory Visit: Payer: Self-pay

## 2021-05-03 DIAGNOSIS — Z0000 Encounter for general adult medical examination without abnormal findings: Secondary | ICD-10-CM

## 2021-05-03 DIAGNOSIS — I10 Essential (primary) hypertension: Secondary | ICD-10-CM

## 2021-05-04 LAB — COMPREHENSIVE METABOLIC PANEL
ALT: 33 IU/L (ref 0–44)
AST: 20 IU/L (ref 0–40)
Albumin/Globulin Ratio: 2.1 (ref 1.2–2.2)
Albumin: 4.6 g/dL (ref 4.0–5.0)
Alkaline Phosphatase: 80 IU/L (ref 44–121)
BUN/Creatinine Ratio: 14 (ref 9–20)
BUN: 13 mg/dL (ref 6–24)
Bilirubin Total: 0.4 mg/dL (ref 0.0–1.2)
CO2: 20 mmol/L (ref 20–29)
Calcium: 9.6 mg/dL (ref 8.7–10.2)
Chloride: 105 mmol/L (ref 96–106)
Creatinine, Ser: 0.92 mg/dL (ref 0.76–1.27)
Globulin, Total: 2.2 g/dL (ref 1.5–4.5)
Glucose: 79 mg/dL (ref 70–99)
Potassium: 4.6 mmol/L (ref 3.5–5.2)
Sodium: 140 mmol/L (ref 134–144)
Total Protein: 6.8 g/dL (ref 6.0–8.5)
eGFR: 106 mL/min/{1.73_m2} (ref >59)

## 2021-05-04 LAB — LIPID PANEL
Chol/HDL Ratio: 4.4 ratio (ref 0.0–5.0)
Cholesterol, Total: 189 mg/dL (ref 100–199)
HDL: 43 mg/dL (ref >39)
LDL Chol Calc (NIH): 107 mg/dL — ABNORMAL HIGH (ref 0–99)
Triglycerides: 225 mg/dL — ABNORMAL HIGH (ref 0–149)
VLDL Cholesterol Cal: 39 mg/dL (ref 5–40)

## 2021-05-04 LAB — CBC WITH DIFFERENTIAL/PLATELET
Basophils Absolute: 0.1 10*3/uL (ref 0.0–0.2)
Basos: 1 %
EOS (ABSOLUTE): 0.3 10*3/uL (ref 0.0–0.4)
Eos: 4 %
Hematocrit: 45.6 % (ref 37.5–51.0)
Hemoglobin: 15.9 g/dL (ref 13.0–17.7)
Immature Grans (Abs): 0 10*3/uL (ref 0.0–0.1)
Immature Granulocytes: 0 %
Lymphocytes Absolute: 2.4 10*3/uL (ref 0.7–3.1)
Lymphs: 36 %
MCH: 30.3 pg (ref 26.6–33.0)
MCHC: 34.9 g/dL (ref 31.5–35.7)
MCV: 87 fL (ref 79–97)
Monocytes Absolute: 0.5 10*3/uL (ref 0.1–0.9)
Monocytes: 8 %
Neutrophils Absolute: 3.3 10*3/uL (ref 1.4–7.0)
Neutrophils: 51 %
Platelets: 265 10*3/uL (ref 150–450)
RBC: 5.25 x10E6/uL (ref 4.14–5.80)
RDW: 13 % (ref 11.6–15.4)
WBC: 6.6 10*3/uL (ref 3.4–10.8)

## 2021-05-04 LAB — HEMOGLOBIN A1C
Est. average glucose Bld gHb Est-mCnc: 108 mg/dL
Hgb A1c MFr Bld: 5.4 % (ref 4.8–5.6)

## 2021-05-04 LAB — TSH RFX ON ABNORMAL TO FREE T4: TSH: 1.09 u[IU]/mL (ref 0.450–4.500)

## 2021-05-30 ENCOUNTER — Other Ambulatory Visit: Payer: Self-pay | Admitting: Cardiovascular Disease

## 2021-05-30 DIAGNOSIS — R002 Palpitations: Secondary | ICD-10-CM

## 2021-05-30 DIAGNOSIS — I10 Essential (primary) hypertension: Secondary | ICD-10-CM

## 2021-08-10 ENCOUNTER — Other Ambulatory Visit: Payer: Self-pay | Admitting: Cardiovascular Disease

## 2021-08-10 DIAGNOSIS — I10 Essential (primary) hypertension: Secondary | ICD-10-CM

## 2021-08-10 DIAGNOSIS — R002 Palpitations: Secondary | ICD-10-CM

## 2021-10-06 ENCOUNTER — Other Ambulatory Visit: Payer: Self-pay | Admitting: Cardiovascular Disease

## 2021-10-23 ENCOUNTER — Ambulatory Visit (HOSPITAL_BASED_OUTPATIENT_CLINIC_OR_DEPARTMENT_OTHER): Payer: No Typology Code available for payment source

## 2021-10-23 DIAGNOSIS — Z0000 Encounter for general adult medical examination without abnormal findings: Secondary | ICD-10-CM

## 2021-10-24 LAB — COMPREHENSIVE METABOLIC PANEL
ALT: 28 IU/L (ref 0–44)
AST: 20 IU/L (ref 0–40)
Albumin/Globulin Ratio: 2 (ref 1.2–2.2)
Albumin: 4.5 g/dL (ref 4.1–5.1)
Alkaline Phosphatase: 72 IU/L (ref 44–121)
BUN/Creatinine Ratio: 15 (ref 9–20)
BUN: 16 mg/dL (ref 6–24)
Bilirubin Total: 0.5 mg/dL (ref 0.0–1.2)
CO2: 22 mmol/L (ref 20–29)
Calcium: 9.3 mg/dL (ref 8.7–10.2)
Chloride: 106 mmol/L (ref 96–106)
Creatinine, Ser: 1.05 mg/dL (ref 0.76–1.27)
Globulin, Total: 2.3 g/dL (ref 1.5–4.5)
Glucose: 97 mg/dL (ref 70–99)
Potassium: 4.5 mmol/L (ref 3.5–5.2)
Sodium: 140 mmol/L (ref 134–144)
Total Protein: 6.8 g/dL (ref 6.0–8.5)
eGFR: 90 mL/min/{1.73_m2} (ref >59)

## 2021-10-24 LAB — CBC WITH DIFFERENTIAL/PLATELET
Basophils Absolute: 0.1 10*3/uL (ref 0.0–0.2)
Basos: 1 %
EOS (ABSOLUTE): 0.2 10*3/uL (ref 0.0–0.4)
Eos: 3 %
Hematocrit: 45.1 % (ref 37.5–51.0)
Hemoglobin: 15.8 g/dL (ref 13.0–17.7)
Immature Grans (Abs): 0 10*3/uL (ref 0.0–0.1)
Immature Granulocytes: 0 %
Lymphocytes Absolute: 2.5 10*3/uL (ref 0.7–3.1)
Lymphs: 38 %
MCH: 31 pg (ref 26.6–33.0)
MCHC: 35 g/dL (ref 31.5–35.7)
MCV: 89 fL (ref 79–97)
Monocytes Absolute: 0.5 10*3/uL (ref 0.1–0.9)
Monocytes: 8 %
Neutrophils Absolute: 3.3 10*3/uL (ref 1.4–7.0)
Neutrophils: 50 %
Platelets: 237 10*3/uL (ref 150–450)
RBC: 5.09 x10E6/uL (ref 4.14–5.80)
RDW: 12.2 % (ref 11.6–15.4)
WBC: 6.6 10*3/uL (ref 3.4–10.8)

## 2021-10-24 LAB — TSH: TSH: 1.36 u[IU]/mL (ref 0.450–4.500)

## 2021-10-31 ENCOUNTER — Ambulatory Visit (INDEPENDENT_AMBULATORY_CARE_PROVIDER_SITE_OTHER): Payer: No Typology Code available for payment source | Admitting: Family Medicine

## 2021-10-31 ENCOUNTER — Encounter (HOSPITAL_BASED_OUTPATIENT_CLINIC_OR_DEPARTMENT_OTHER): Payer: Self-pay | Admitting: Family Medicine

## 2021-10-31 VITALS — BP 138/89 | HR 73 | Temp 97.7°F | Ht 69.0 in | Wt 220.5 lb

## 2021-10-31 DIAGNOSIS — Z0000 Encounter for general adult medical examination without abnormal findings: Secondary | ICD-10-CM | POA: Diagnosis not present

## 2021-10-31 DIAGNOSIS — Z23 Encounter for immunization: Secondary | ICD-10-CM

## 2021-10-31 NOTE — Patient Instructions (Signed)
  Medication Instructions:  Your physician recommends that you continue on your current medications as directed. Please refer to the Current Medication list given to you today. --If you need a refill on any your medications before your next appointment, please call your pharmacy first. If no refills are authorized on file call the office.-- Lab Work: Your physician has recommended that you have lab work today: No If you have labs (blood work) drawn today and your tests are completely normal, you will receive your results via MyChart message OR a phone call from our staff.  Please ensure you check your voicemail in the event that you authorized detailed messages to be left on a delegated number. If you have any lab test that is abnormal or we need to change your treatment, we will call you to review the results.  Referrals/Procedures/Imaging: No  Follow-Up: Your next appointment:   Your physician recommends that you schedule a follow-up appointment in: 1 year cpe with Dr. de [place].  You will receive a text message or e-mail with a link to a survey about your care and experience with us today! We would greatly appreciate your feedback!   Thanks for letting us be apart of your health journey!!  Primary Care and Sports Medicine   Dr. Raymond de [place]   We encourage you to activate your patient portal called "MyChart".  Sign up information is provided on this After Visit Summary.  MyChart is used to connect with patients for Virtual Visits (Telemedicine).  Patients are able to view lab/test results, encounter notes, upcoming appointments, etc.  Non-urgent messages can be sent to your provider as well. To learn more about what you can do with MyChart, please visit --  [email or web address].    

## 2021-10-31 NOTE — Progress Notes (Signed)
  Subjective:    CC: Annual Physical Exam  HPI:  Chris Collins is a 46 y.o. presenting for annual physical  I reviewed the past medical history, family history, social history, surgical history, and allergies today and no changes were needed.  Please see the problem list section below in epic for further details.  Past Medical History: History reviewed. No pertinent past medical history. Past Surgical History: History reviewed. No pertinent surgical history. Social History: Social History   Socioeconomic History   Marital status: Married    Spouse name: Not on file   Number of children: Not on file   Years of education: Not on file   Highest education level: Not on file  Occupational History   Not on file  Tobacco Use   Smoking status: Never   Smokeless tobacco: Never  Vaping Use   Vaping Use: Never used  Substance and Sexual Activity   Alcohol use: Never   Drug use: Never   Sexual activity: Not on file  Other Topics Concern   Not on file  Social History Narrative   Not on file   Social Determinants of Health   Financial Resource Strain: Not on file  Food Insecurity: Not on file  Transportation Needs: Not on file  Physical Activity: Not on file  Stress: Not on file  Social Connections: Not on file   Family History: History reviewed. No pertinent family history. Allergies: No Known Allergies Medications: See med rec.  Review of Systems: No headache, visual changes, nausea, vomiting, diarrhea, constipation, dizziness, abdominal pain, skin rash, fevers, chills, night sweats, swollen lymph nodes, weight loss, chest pain, body aches, joint swelling, muscle aches, shortness of breath, mood changes, visual or auditory hallucinations.  Objective:    BP 138/89   Pulse 73   Temp 97.7 F (36.5 C) (Oral)   Ht 5\' 9"  (1.753 m)   Wt 220 lb 8 oz (100 kg)   SpO2 97%   BMI 32.56 kg/m   General: Well Developed, well nourished, and in no acute distress.  Neuro: Alert  and oriented x3, extra-ocular muscles intact, sensation grossly intact. Cranial nerves II through XII are intact, motor, sensory, and coordinative functions are all intact. HEENT: Normocephalic, atraumatic, pupils equal round reactive to light, neck supple, no masses, no lymphadenopathy, thyroid nonpalpable. Oropharynx, nasopharynx, external ear canals are unremarkable. Skin: Warm and dry, no rashes noted.  Cardiac: Regular rate and rhythm, no murmurs rubs or gallops.  Respiratory: Clear to auscultation bilaterally. Not using accessory muscles, speaking in full sentences.  Abdominal: Soft, nontender, nondistended, positive bowel sounds, no masses, no organomegaly.  Musculoskeletal: Shoulder, elbow, wrist, hip, knee, ankle stable, and with full range of motion.  Impression and Recommendations:    Wellness examination Routine HCM labs reviewed. HCM reviewed/discussed. Anticipatory guidance regarding healthy weight, lifestyle and choices given. Recommend healthy diet.  Recommend approximately 150 minutes/week of moderate intensity exercise Recommend regular dental and vision exams Always use seatbelt/lap and shoulder restraints Recommend using smoke alarms and checking batteries at least twice a year Recommend using sunscreen when outside Discussed tetanus immunization recommendations, patient agreed to proceed with this today Patient is also due for seasonal influenza vaccine, administered today  Return in about 1 year (around 11/01/2022) for CPE.   ___________________________________________ Anthonette Lesage de 01/01/2023, MD, ABFM, CAQSM Primary Care and Sports Medicine [facility]

## 2021-10-31 NOTE — Addendum Note (Signed)
Addended by: Hendricks Milo on: 10/31/2021 11:33 AM   Modules accepted: Orders

## 2021-10-31 NOTE — Assessment & Plan Note (Signed)
Routine HCM labs reviewed. HCM reviewed/discussed. Anticipatory guidance regarding healthy weight, lifestyle and choices given. Recommend healthy diet.  Recommend approximately 150 minutes/week of moderate intensity exercise Recommend regular dental and vision exams Always use seatbelt/lap and shoulder restraints Recommend using smoke alarms and checking batteries at least twice a year Recommend using sunscreen when outside Discussed tetanus immunization recommendations, patient agreed to proceed with this today Patient is also due for seasonal influenza vaccine, administered today

## 2021-11-04 ENCOUNTER — Other Ambulatory Visit: Payer: Self-pay | Admitting: Cardiovascular Disease

## 2021-11-04 DIAGNOSIS — I10 Essential (primary) hypertension: Secondary | ICD-10-CM

## 2021-11-04 DIAGNOSIS — R002 Palpitations: Secondary | ICD-10-CM

## 2022-02-01 ENCOUNTER — Other Ambulatory Visit: Payer: Self-pay | Admitting: Cardiovascular Disease

## 2022-02-01 DIAGNOSIS — I10 Essential (primary) hypertension: Secondary | ICD-10-CM

## 2022-02-01 DIAGNOSIS — R002 Palpitations: Secondary | ICD-10-CM

## 2022-04-02 ENCOUNTER — Other Ambulatory Visit: Payer: Self-pay | Admitting: Cardiovascular Disease

## 2022-06-17 IMAGING — DX DG CHEST 2V
2 series · 2 of 2 positions shown · non-contrast
Comparison: None.

CLINICAL DATA: Chest pain

EXAM:
CHEST - 2 VIEW

[chest pa]
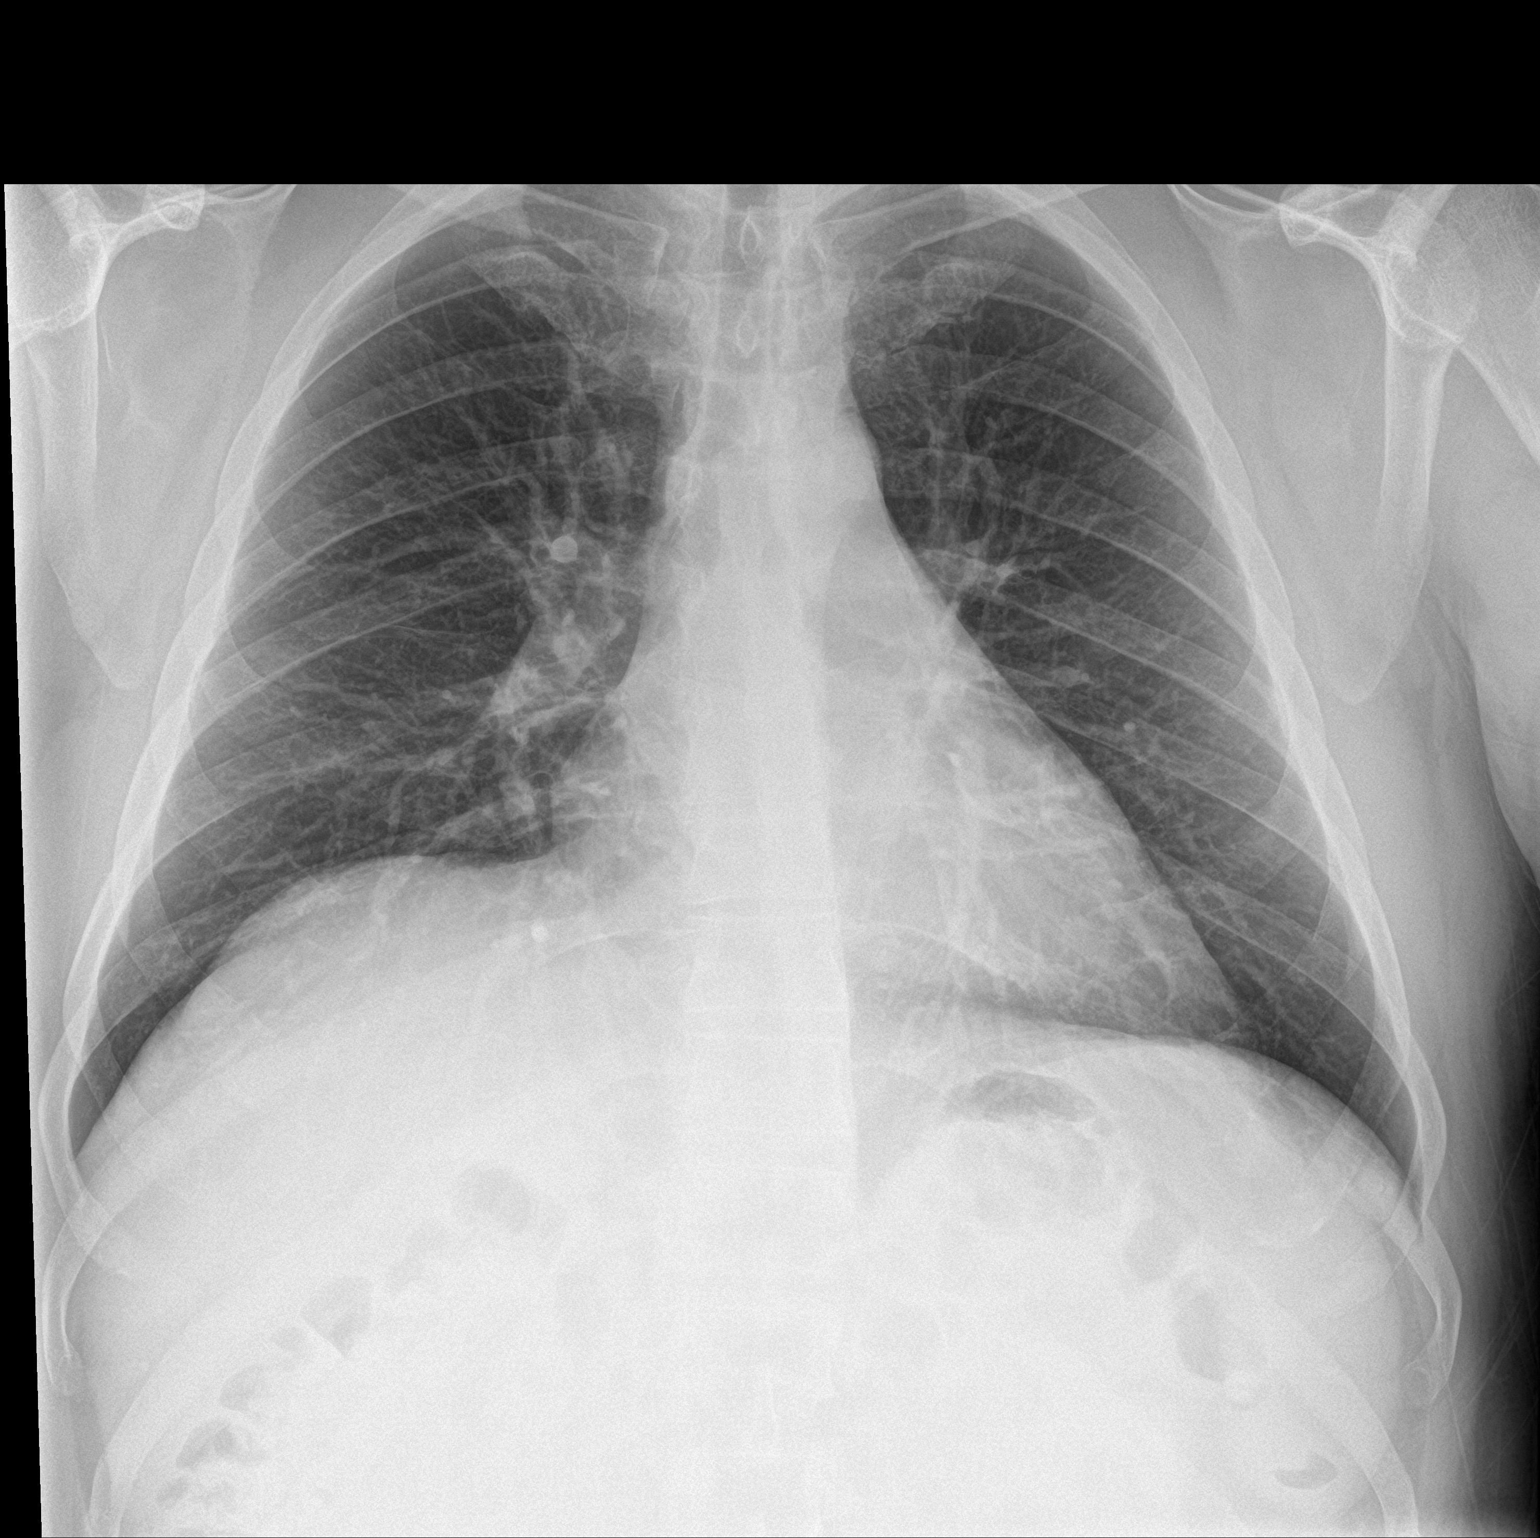

[chest lat]
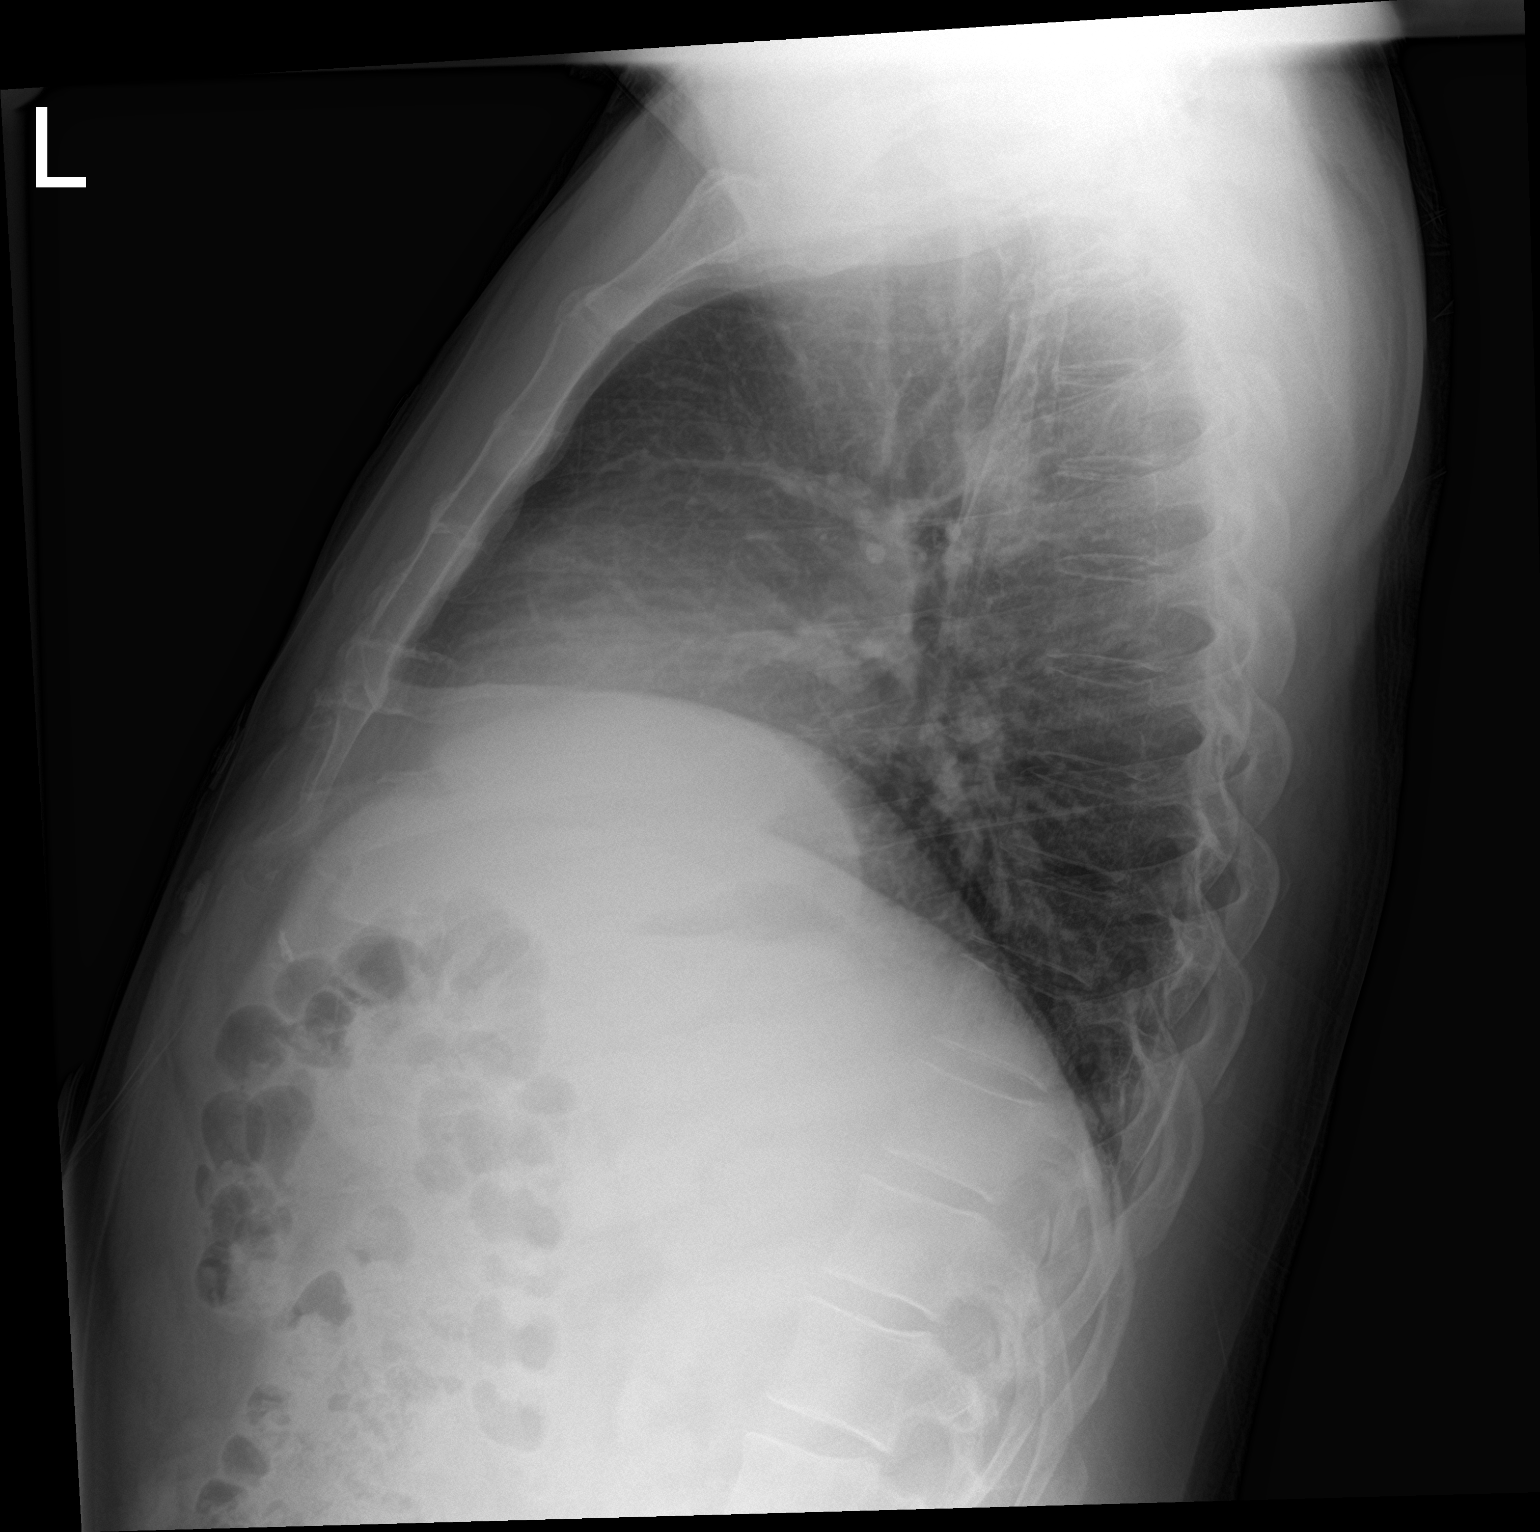

[2 of 2 positions shown; findings below may reference images not displayed]

FINDINGS: The heart size and mediastinal contours are within normal limits.
Both lungs are clear. The visualized skeletal structures are
unremarkable.
IMPRESSION: Normal study.

## 2022-10-04 ENCOUNTER — Other Ambulatory Visit: Payer: Self-pay | Admitting: Cardiovascular Disease

## 2023-02-06 ENCOUNTER — Other Ambulatory Visit: Payer: Self-pay | Admitting: Cardiovascular Disease

## 2023-02-06 DIAGNOSIS — I10 Essential (primary) hypertension: Secondary | ICD-10-CM

## 2023-02-06 DIAGNOSIS — R002 Palpitations: Secondary | ICD-10-CM

## 2023-03-03 ENCOUNTER — Other Ambulatory Visit: Payer: Self-pay | Admitting: Cardiovascular Disease

## 2023-03-03 DIAGNOSIS — I10 Essential (primary) hypertension: Secondary | ICD-10-CM

## 2023-03-03 DIAGNOSIS — R002 Palpitations: Secondary | ICD-10-CM

## 2023-03-12 ENCOUNTER — Other Ambulatory Visit: Payer: Self-pay | Admitting: Cardiovascular Disease

## 2023-03-12 DIAGNOSIS — R002 Palpitations: Secondary | ICD-10-CM

## 2023-03-12 DIAGNOSIS — I10 Essential (primary) hypertension: Secondary | ICD-10-CM

## 2023-03-21 ENCOUNTER — Other Ambulatory Visit: Payer: Self-pay | Admitting: Cardiovascular Disease

## 2023-03-21 DIAGNOSIS — I10 Essential (primary) hypertension: Secondary | ICD-10-CM

## 2023-03-21 DIAGNOSIS — R002 Palpitations: Secondary | ICD-10-CM

## 2023-03-24 NOTE — Telephone Encounter (Signed)
Patient has request nebivolol 5 mg. His last office visit was 04/17/21 and doesn't have a scheduled visit coming up. Has already had three refill request.

## 2023-04-02 ENCOUNTER — Other Ambulatory Visit: Payer: Self-pay | Admitting: Cardiovascular Disease

## 2023-04-04 ENCOUNTER — Other Ambulatory Visit: Payer: Self-pay | Admitting: Cardiovascular Disease

## 2023-04-24 ENCOUNTER — Other Ambulatory Visit: Payer: Self-pay | Admitting: Cardiovascular Disease

## 2023-04-29 ENCOUNTER — Encounter: Payer: Self-pay | Admitting: Cardiovascular Disease

## 2023-04-29 ENCOUNTER — Ambulatory Visit: Payer: PRIVATE HEALTH INSURANCE | Attending: Cardiovascular Disease | Admitting: Cardiovascular Disease

## 2023-04-29 VITALS — BP 120/88 | HR 86 | Ht 69.0 in | Wt 214.8 lb

## 2023-04-29 DIAGNOSIS — I10 Essential (primary) hypertension: Secondary | ICD-10-CM

## 2023-04-29 DIAGNOSIS — R002 Palpitations: Secondary | ICD-10-CM | POA: Diagnosis not present

## 2023-04-29 MED ORDER — OLMESARTAN MEDOXOMIL 5 MG PO TABS: 5.0000 mg | ORAL_TABLET | Freq: Every day | ORAL | 4 refills | Status: AC

## 2023-04-29 MED ORDER — NEBIVOLOL HCL 5 MG PO TABS: 5.0000 mg | ORAL_TABLET | Freq: Every day | ORAL | 4 refills | Status: AC

## 2023-04-29 NOTE — Assessment & Plan Note (Signed)
 History of palpitations with event monitor performed 02/05/2021 that showed occasional PACs, PVCs and short runs of SVT.  We had talked about triggers of palpitations including alcohol caffeine and stimulants as well as obstructive sleep apnea.  I did place him on Bystolic which resulted in marked improvement in his symptoms.  He no longer complains of palpitations.

## 2023-04-29 NOTE — Progress Notes (Signed)
 04/29/2023 Larren Copes   Apr 06, 1975  [id number]  Primary Physician de [place], Raymond J, MD Primary Cardiologist: Runell Gess MD Nicholes Calamity, [place]  HPI:  Chris Collins is a 48 y.o.   mildly overweight married Caucasian male father of 3 children who works as a Emergency planning/management officer for a [employer].  He was referred by the emergency room after he presented there on 12/03/2020 with palpitations and hypertension.  His work-up was unrevealing.  I last saw him in the office 04/17/2021.  He has no cardiac risk factors.  There is no family history for heart disease.  He is never had a heart attack or stroke.  Denies chest pain or shortness of breath.  He was drinking Diet Coke once a day prior to this and has since discontinued caffeine intake.  He exercises sporadically.  I got a 2D echo cardiogram 02/27/2021 that was essentially normal and an event monitor 02/05/2021 that showed occasional PACs, PVCs and short runs of SVT.  I referred him to our Pharm.D. hypertension clinic who began him on Bystolic and olmesartan.  His palpitations have resolved, his heart rate has come down and his blood pressure is improved.  Since I saw him 2 years ago he is remained stable.  His blood pressure remains under good control on his current medications.  His palpitations have resolved.  He denies symptoms of obstructive sleep apnea.  He denies chest pain or shortness of breath.   No outpatient medications have been marked as taking for the 04/29/23 encounter (Office Visit) with Runell Gess, MD.     No Known Allergies  Social History   Socioeconomic History   Marital status: Married    Spouse name: Not on file   Number of children: Not on file   Years of education: Not on file   Highest education level: Not on file  Occupational History   Not on file  Tobacco Use   Smoking status: Never   Smokeless tobacco: Never  Vaping Use   Vaping status: Never Used  Substance and Sexual Activity    Alcohol use: Never   Drug use: Never   Sexual activity: Not on file  Other Topics Concern   Not on file  Social History Narrative   Not on file   Social Drivers of Health   Financial Resource Strain: Not on file  Food Insecurity: Not on file  Transportation Needs: Not on file  Physical Activity: Not on file  Stress: Not on file  Social Connections: Not on file  Intimate Partner Violence: Not on file     Review of Systems: General: negative for chills, fever, night sweats or weight changes.  Cardiovascular: negative for chest pain, dyspnea on exertion, edema, orthopnea, palpitations, paroxysmal nocturnal dyspnea or shortness of breath Dermatological: negative for rash Respiratory: negative for cough or wheezing Urologic: negative for hematuria Abdominal: negative for nausea, vomiting, diarrhea, bright red blood per rectum, melena, or hematemesis Neurologic: negative for visual changes, syncope, or dizziness All other systems reviewed and are otherwise negative except as noted above.    Blood pressure 120/88, pulse 86, height 5\' 9"  (1.753 m), weight 214 lb 12.8 oz (97.4 kg).  General appearance: alert and no distress Neck: no adenopathy, no carotid bruit, no JVD, supple, symmetrical, trachea midline, and thyroid not enlarged, symmetric, no tenderness/mass/nodules Lungs: clear to auscultation bilaterally Heart: regular rate and rhythm, S1, S2 normal, no murmur, click, rub or gallop Extremities: extremities normal, atraumatic, no  cyanosis or edema Pulses: 2+ and symmetric Skin: Skin color, texture, turgor normal. No rashes or lesions Neurologic: Grossly normal  EKG EKG Interpretation Date/Time:  Wednesday April 29 2023 09:34:21 EST Ventricular Rate:  86 PR Interval:  142 QRS Duration:  88 QT Interval:  382 QTC Calculation: 457 R Axis:   -10  Text Interpretation: Normal sinus rhythm Normal ECG When compared with ECG of 03-Dec-2020 16:16, Nonspecific T wave abnormality no  longer evident in Anterolateral leads Confirmed by Nanetta Batty [phone]) on 04/29/2023 9:43:32 AM    ASSESSMENT AND PLAN:   Essential hypertension History of essential hypertension her blood pressure measured today at 120/88.  He is on Bystolic and olmesartan.  Palpitations History of palpitations with event monitor performed 02/05/2021 that showed occasional PACs, PVCs and short runs of SVT.  We had talked about triggers of palpitations including alcohol caffeine and stimulants as well as obstructive sleep apnea.  I did place him on Bystolic which resulted in marked improvement in his symptoms.  He no longer complains of palpitations.     Runell Gess MD FACP,FACC,FAHA, [facility] 04/29/2023 9:49 AM

## 2023-04-29 NOTE — Assessment & Plan Note (Signed)
 History of essential hypertension her blood pressure measured today at 120/88.  He is on Bystolic and olmesartan.

## 2023-04-29 NOTE — Patient Instructions (Signed)
 Medication Instructions:  Your physician recommends that you continue on your current medications as directed. Please refer to the Current Medication list given to you today.  *If you need a refill on your cardiac medications before your next appointment, please call your pharmacy*   Follow-Up: At [facility], you and your health needs are our priority.  As part of our continuing mission to provide you with exceptional heart care, we have created designated Provider Care Teams.  These Care Teams include your primary Cardiologist (physician) and Advanced Practice Providers (APPs -  Physician Assistants and Nurse Practitioners) who all work together to provide you with the care you need, when you need it.  We recommend signing up for the patient portal called "MyChart".  Sign up information is provided on this After Visit Summary.  MyChart is used to connect with patients for Virtual Visits (Telemedicine).  Patients are able to view lab/test results, encounter notes, upcoming appointments, etc.  Non-urgent messages can be sent to your provider as well.   To learn more about what you can do with MyChart, go to [email or web address].    Your next appointment:   We will see you on an as needed basis.  Provider:   Nanetta Batty, MD
